# Patient Record
Sex: Female | Born: 1995
Health system: Southern US, Community
[De-identification: ages and names within clinical notes are randomized; demographics above are authoritative.]

## PROBLEM LIST (undated history)

## (undated) DIAGNOSIS — Z789 Other specified health status: Secondary | ICD-10-CM

## (undated) DIAGNOSIS — J45909 Unspecified asthma, uncomplicated: Secondary | ICD-10-CM

## (undated) DIAGNOSIS — O139 Gestational [pregnancy-induced] hypertension without significant proteinuria, unspecified trimester: Secondary | ICD-10-CM

## (undated) HISTORY — PX: WISDOM TOOTH EXTRACTION: SHX21

## (undated) HISTORY — DX: Gestational (pregnancy-induced) hypertension without significant proteinuria, unspecified trimester: O13.9

## (undated) HISTORY — DX: Other specified health status: Z78.9

---
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
## 2014-03-16 ENCOUNTER — Encounter (HOSPITAL_COMMUNITY): Payer: Self-pay | Admitting: Emergency Medicine

## 2014-03-16 ENCOUNTER — Emergency Department (HOSPITAL_COMMUNITY)
Admission: EM | Admit: 2014-03-16 | Discharge: 2014-03-16 | Disposition: A | Discharge status: Home / Self Care | Payer: Medicaid Other | Attending: Emergency Medicine | Admitting: Emergency Medicine

## 2014-03-16 DIAGNOSIS — R454 Irritability and anger: Secondary | ICD-10-CM | POA: Insufficient documentation

## 2014-03-16 DIAGNOSIS — M549 Dorsalgia, unspecified: Secondary | ICD-10-CM

## 2014-03-16 DIAGNOSIS — M545 Low back pain, unspecified: Secondary | ICD-10-CM | POA: Insufficient documentation

## 2014-03-16 DIAGNOSIS — N644 Mastodynia: Secondary | ICD-10-CM | POA: Insufficient documentation

## 2014-03-16 DIAGNOSIS — Z87891 Personal history of nicotine dependence: Secondary | ICD-10-CM | POA: Insufficient documentation

## 2014-03-16 DIAGNOSIS — R109 Unspecified abdominal pain: Secondary | ICD-10-CM

## 2014-03-16 DIAGNOSIS — Z3202 Encounter for pregnancy test, result negative: Secondary | ICD-10-CM | POA: Insufficient documentation

## 2014-03-16 DIAGNOSIS — N390 Urinary tract infection, site not specified: Secondary | ICD-10-CM | POA: Insufficient documentation

## 2014-03-16 DIAGNOSIS — R635 Abnormal weight gain: Secondary | ICD-10-CM | POA: Insufficient documentation

## 2014-03-16 LAB — CBC WITH DIFFERENTIAL/PLATELET
Basophils Absolute: 0 10*3/uL (ref 0.0–0.1)
Basophils Relative: 0 % (ref 0–1)
Eosinophils Absolute: 0.1 10*3/uL (ref 0.0–0.7)
Eosinophils Relative: 1 % (ref 0–5)
HCT: 43.7 % (ref 36.0–46.0)
Hemoglobin: 15.3 g/dL — ABNORMAL HIGH (ref 12.0–15.0)
Lymphocytes Relative: 29 % (ref 12–46)
Lymphs Abs: 3.1 10*3/uL (ref 0.7–4.0)
MCH: 29.2 pg (ref 26.0–34.0)
MCHC: 35 g/dL (ref 30.0–36.0)
MCV: 83.4 fL (ref 78.0–100.0)
Monocytes Absolute: 0.7 10*3/uL (ref 0.1–1.0)
Monocytes Relative: 7 % (ref 3–12)
Neutro Abs: 6.8 10*3/uL (ref 1.7–7.7)
Neutrophils Relative %: 63 % (ref 43–77)
Platelets: 299 10*3/uL (ref 150–400)
RBC: 5.24 MIL/uL — ABNORMAL HIGH (ref 3.87–5.11)
RDW: 13.2 % (ref 11.5–15.5)
WBC: 10.6 10*3/uL — ABNORMAL HIGH (ref 4.0–10.5)

## 2014-03-16 LAB — BASIC METABOLIC PANEL
BUN: 12 mg/dL (ref 6–23)
CO2: 23 mEq/L (ref 19–32)
Calcium: 10 mg/dL (ref 8.4–10.5)
Chloride: 100 mEq/L (ref 96–112)
Creatinine, Ser: 0.53 mg/dL (ref 0.50–1.10)
GFR calc Af Amer: >90 mL/min (ref >90)
GFR calc non Af Amer: >90 mL/min (ref >90)
Glucose, Bld: 110 mg/dL — ABNORMAL HIGH (ref 70–99)
Potassium: 3.5 mEq/L — ABNORMAL LOW (ref 3.7–5.3)
Sodium: 139 mEq/L (ref 137–147)

## 2014-03-16 LAB — POC URINE PREG, ED: Preg Test, Ur: NEGATIVE

## 2014-03-16 LAB — URINE MICROSCOPIC-ADD ON

## 2014-03-16 LAB — URINALYSIS, ROUTINE W REFLEX MICROSCOPIC
Bilirubin Urine: NEGATIVE
Glucose, UA: NEGATIVE mg/dL
Nitrite: NEGATIVE
Protein, ur: 30 mg/dL — AB
Specific Gravity, Urine: 1.025 (ref 1.005–1.030)
Urobilinogen, UA: 0.2 mg/dL (ref 0.0–1.0)
pH: 7.5 (ref 5.0–8.0)

## 2014-03-16 LAB — HCG, QUANTITATIVE, PREGNANCY: hCG, Beta Chain, Quant, S: <1 m[IU]/mL (ref <5)

## 2014-03-16 MED ORDER — TRAMADOL HCL 50 MG PO TABS: 50.0000 mg | ORAL_TABLET | Freq: Four times a day (QID) | ORAL | Status: DC | PRN

## 2014-03-16 MED ORDER — SODIUM CHLORIDE 0.9 % IV BOLUS (SEPSIS)
1000.0000 mL | Freq: Once | INTRAVENOUS | Status: AC
  Administered 2014-03-16: 1000 mL via INTRAVENOUS

## 2014-03-16 MED ORDER — CEPHALEXIN 500 MG PO CAPS: 500.0000 mg | ORAL_CAPSULE | Freq: Four times a day (QID) | ORAL | Status: DC

## 2014-03-16 MED ORDER — MORPHINE SULFATE 4 MG/ML IJ SOLN
INTRAMUSCULAR | Status: AC
  Filled 2014-03-16: qty 1

## 2014-03-16 MED ORDER — MORPHINE SULFATE 4 MG/ML IJ SOLN
4.0000 mg | Freq: Once | INTRAMUSCULAR | Status: AC
  Administered 2014-03-16: 4 mg via INTRAVENOUS

## 2014-03-16 MED ORDER — DEXTROSE 5 % IV SOLN
1.0000 g | Freq: Once | INTRAVENOUS | Status: AC
  Administered 2014-03-16: 1 g via INTRAVENOUS
  Filled 2014-03-16: qty 10

## 2014-03-16 NOTE — ED Notes (Signed)
Pt states that she missed her period 5 weeks ago, has taken several home pregnancy tests with positive results, has had continuous lower back and lower abd pain for the past 5 weeks and started having heavy vaginal bleeding today, pt reports that she has changed her maxi pad twice since the bleeding started at 2:30pm,

## 2014-03-16 NOTE — ED Provider Notes (Signed)
CSN: [id number]     Arrival date & time 03/16/14  1541 History   First MD Initiated Contact with Patient 03/16/14 1623     Chief Complaint  Patient presents with  . Vaginal Bleeding     (Consider location/radiation/quality/duration/timing/severity/associated sxs/prior.  Treatment) HPI  18yf with vaginal bleeding. She think she is pregnant. Last period in March. Posiitive home pregnancy test 5 weeks ago. She reports weight gain, breast tenderness and mood changes such as irritability. Began having crampy lower abdominal pain, lower back pain and vaginal bleeding today. No fevers or chills. His no urinary complaints. N significant past medical history. No prior pregnancies.  History reviewed. No pertinent past medical history. History reviewed. No pertinent past surgical history. No family history on file. History  Substance Use Topics  . Smoking status: Former Games developermoker  . Smokeless tobacco: Not on file  . Alcohol Use: No   OB History   Grav Para Term Preterm Abortions TAB SAB Ect Mult Living                 Review of Systems  All systems reviewed and negative, other than as noted in HPI.   Allergies  Review of patient's allergies indicates no known allergies.  Home Medications   Prior to Admission medications   Not on File   BP 166/106  Pulse 132  Temp(Src) 98.4 F (36.9 C) (Oral)  Resp 20  Ht 4' 10.5" (1.486 m)  Wt 169 lb 7 oz (76.856 kg)  BMI 34.80 kg/m2  SpO2 95%  LMP 01/09/2014 Physical Exam  Nursing note and vitals reviewed. Constitutional: She is oriented to person, place, and time. She appears well-developed and well-nourished. No distress.  HENT:  Head: Normocephalic and atraumatic.  Eyes: Conjunctivae are normal. Right eye exhibits no discharge. Left eye exhibits no discharge.  Neck: Neck supple.  Cardiovascular: Normal rate, regular rhythm and normal heart sounds.  Exam reveals no gallop and no friction rub.   No murmur heard. Pulmonary/Chest: Effort  normal and breath sounds normal. No respiratory distress.  Abdominal: Soft. She exhibits no distension. There is no tenderness.  Genitourinary:  No cva tenderness  Musculoskeletal: She exhibits no edema and no tenderness.  Neurological: She is alert and oriented to person, place, and time.  Skin: Skin is warm and dry.  Psychiatric: She has a normal mood and affect. Her behavior is normal. Thought content normal.    ED Course  Procedures (including critical care time) Labs Review Labs Reviewed  CBC WITH DIFFERENTIAL - Abnormal; Notable for the following:    WBC 10.6 (*)    RBC 5.24 (*)    Hemoglobin 15.3 (*)    All other components within normal limits  BASIC METABOLIC PANEL - Abnormal; Notable for the following:    Potassium 3.5 (*)    Glucose, Bld 110 (*)    All other components within normal limits  HCG, QUANTITATIVE, PREGNANCY  TSH  URINALYSIS, ROUTINE W REFLEX MICROSCOPIC  POC URINE PREG, ED    Imaging Review No results found.   EKG Interpretation   Date/Time:  Sunday Mar 16 2014 17:29:32 EDT Ventricular Rate:  97 PR Interval:  148 QRS Duration: 91 QT Interval:  350 QTC Calculation: 445 R Axis:   13 Text Interpretation:  Sinus rhythm ED PHYSICIAN INTERPRETATION AVAILABLE  IN [facility] Confirmed by TEST, Record ([id number]) on 03/18/2014 9:31:22  AM      MDM   Final diagnoses:  UTI (urinary tract infection)  Abdominal pain  Back pain    18yF who thinks she is pregnant. She is not. Possible UTI. With crampy lower abdominal pain, will tx. Return precautions dicussed.     Raeford RazorStephen Duane Trias, MD 03/24/14 [phone]

## 2014-03-16 NOTE — ED Notes (Signed)
IV attempt x 2 without success, CN to assess for access

## 2014-03-16 NOTE — Discharge Instructions (Signed)
Abdominal Pain, Adult °Many things can cause abdominal pain. Usually, abdominal pain is not caused by a disease and will improve without treatment. It can often be observed and treated at home. Your health care provider will do a physical exam and possibly order blood tests and X-rays to help determine the seriousness of your pain. However, in many cases, more time must pass before a clear cause of the pain can be found. Before that point, your health care provider may not know if you need more testing or further treatment. °HOME CARE INSTRUCTIONS  °Monitor your abdominal pain for any changes. The following actions may help to alleviate any discomfort you are experiencing: °· Only take over-the-counter or prescription medicines as directed by your health care provider. °· Do not take laxatives unless directed to do so by your health care provider. °· Try a clear liquid diet (broth, tea, or water) as directed by your health care provider. Slowly move to a bland diet as tolerated. °SEEK MEDICAL CARE IF: °· You have unexplained abdominal pain. °· You have abdominal pain associated with nausea or diarrhea. °· You have pain when you urinate or have a bowel movement. °· You experience abdominal pain that wakes you in the night. °· You have abdominal pain that is worsened or improved by eating food. °· You have abdominal pain that is worsened with eating fatty foods. °SEEK IMMEDIATE MEDICAL CARE IF:  °· Your pain does not go away within 2 hours. °· You have a fever. °· You keep throwing up (vomiting). °· Your pain is felt only in portions of the abdomen, such as the right side or the left lower portion of the abdomen. °· You pass bloody or black tarry stools. °MAKE SURE YOU: °· Understand these instructions.   °· Will watch your condition.   °· Will get help right away if you are not doing well or get worse.   °Document Released: 08/03/2005 Document Revised: 08/14/2013 Document Reviewed: 07/03/2013 °ExitCare® Patient  Information ©2014 ExitCare, LLC. ° °Urinary Tract Infection °Urinary tract infections (UTIs) can develop anywhere along your urinary tract. Your urinary tract is your body's drainage system for removing wastes and extra water. Your urinary tract includes two kidneys, two ureters, a bladder, and a urethra. Your kidneys are a pair of bean-shaped organs. Each kidney is about the size of your fist. They are located below your ribs, one on each side of your spine. °CAUSES °Infections are caused by microbes, which are microscopic organisms, including fungi, viruses, and bacteria. These organisms are so small that they can only be seen through a microscope. Bacteria are the microbes that most commonly cause UTIs. °SYMPTOMS  °Symptoms of UTIs may vary by age and gender of the patient and by the location of the infection. Symptoms in young women typically include a frequent and intense urge to urinate and a painful, burning feeling in the bladder or urethra during urination. Older women and men are more likely to be tired, shaky, and weak and have muscle aches and abdominal pain. A fever may mean the infection is in your kidneys. Other symptoms of a kidney infection include pain in your back or sides below the ribs, nausea, and vomiting. °DIAGNOSIS °To diagnose a UTI, your caregiver will ask you about your symptoms. Your caregiver also will ask to provide a urine sample. The urine sample will be tested for bacteria and white blood cells. White blood cells are made by your body to help fight infection. °TREATMENT  °Typically, UTIs can be   treated with medication. Because most UTIs are caused by a bacterial infection, they usually can be treated with the use of antibiotics. The choice of antibiotic and length of treatment depend on your symptoms and the type of bacteria causing your infection. HOME CARE INSTRUCTIONS  If you were prescribed antibiotics, take them exactly as your caregiver instructs you. Finish the medication  even if you feel better after you have only taken some of the medication.  Drink enough water and fluids to keep your urine clear or pale yellow.  Avoid caffeine, tea, and carbonated beverages. They tend to irritate your bladder.  Empty your bladder often. Avoid holding urine for long periods of time.  Empty your bladder before and after sexual intercourse.  After a bowel movement, women should cleanse from front to back. Use each tissue only once. SEEK MEDICAL CARE IF:   You have back pain.  You develop a fever.  Your symptoms do not begin to resolve within 3 days. SEEK IMMEDIATE MEDICAL CARE IF:   You have severe back pain or lower abdominal pain.  You develop chills.  You have nausea or vomiting.  You have continued burning or discomfort with urination. MAKE SURE YOU:   Understand these instructions.  Will watch your condition.  Will get help right away if you are not doing well or get worse. Document Released: 08/03/2005 Document Revised: 04/24/2012 Document Reviewed: 12/02/2011 [facility] Patient Information 2014 [place]Care, [place].   Emergency Department Resource Guide 1) Find a Doctor and Pay Out of Pocket Although you won't have to find out who is covered by your insurance plan, it is a good idea to ask around and get recommendations. You will then need to call the office and see if the doctor you have chosen will accept you as a new patient and what types of options they offer for patients who are self-pay. Some doctors offer discounts or will set up payment plans for their patients who do not have insurance, but you will need to ask so you aren't surprised when you get to your appointment.  2) Contact Your Local Health Department Not all health departments have doctors that can see patients for sick visits, but many do, so it is worth a call to see if yours does. If you don't know where your local health department is, you can check in your phone book. The CDC also has  a tool to help you locate your state's health department, and many state websites also have listings of all of their local health departments.  3) Find a Walk-in Clinic If your illness is not likely to be very severe or complicated, you may want to try a walk in clinic. These are popping up all over the country in pharmacies, drugstores, and shopping centers. They're usually staffed by nurse practitioners or physician assistants that have been trained to treat common illnesses and complaints. They're usually fairly quick and inexpensive. However, if you have serious medical issues or chronic medical problems, these are probably not your best option.  No Primary Care Doctor: - Call Health Connect at  [phone] - they can help you locate a primary care doctor that  accepts your insurance, provides certain services, etc. - Physician Referral Service- [phone]  Chronic Pain Problems: Organization         Address  Phone   Notes  [facility]  [phone] Patients need to be referred by their primary care doctor.   Medication Assistance: Organization  Address  Phone   Notes  [facility]ication [facility] [address] [place], [place] [address] [phone] --Must be a resident of [facility] [place] -- Must have NO insurance coverage whatsoever (no Medicaid/ Medicare, etc.) -- The pt. MUST have a primary care doctor that directs their care regularly and follows them in the community   MedAssist  [phone]   [employer]  [phone]    Agencies that provide inexpensive medical care: Organization         Address  Phone   Notes  [facility]  [phone]   [facility]    [phone]   [facility] [address] [place], [place] [address] [phone]   [facility] [address], [place] [phone]   Planned Parenthood    [phone]) [phone]    [facility]    [phone]) [phone]   [facility]  [address], [place] Phone:  [phone], Fax:  [phone] Hours of Operation:  9 am - 6 pm, M-F.  Also accepts Medicaid/Medicare and self-pay.  [facility]  [address], [place] Phone: [phone]) [phone], Fax: [phone]. Hours of Operation:  8:30 am - 5:30 pm, M-F.  Also accepts Medicaid and self-pay.  [facility] [place] [address], [place] Phone: [phone]   [facility] [address], [place], [place] [phone], Ext. [phone] Mondays & Thursdays: 7-9 AM.  First 15 patients are seen on a first come, first serve basis.    Medicaid-accepting [place] [place] Providers:  Organization         Address  Phone   Notes  [facility] [address], [place] [phone] Also accepts self-pay patients.  [facility] [address] [place], [place]  [phone]   [facility] [address], [place] [phone]   [facility] [address] [place] [address], [place] [phone]   Renaye RakersVeita Bland [address], [place]   [phone] Only accepts [place] Access [place]icaid patients after they have their name applied to their card.   Self-Pay (no insurance) in [facility] [place]:  Organization         Address  Phone   Notes  Sickle Cell Patients, [facility] [address], [place] [phone]   [facility] [address] [place], [place] [phone]) [phone]   [facility] [place]  [address], [place] [phone]   [facility]/Dr. Osei-Bonsu  [address] [place] [address] [place] [address], [place] or [phone], [place] [phone] Phone number for both [place] and [place] locations is the same.  [facility] [address], [place] [phone]   [facility] [place] [address] [place] [address], [place] or [address] [phone]   [facility] [address], [place] [phone], phone; [phone]) [phone], fax Sees patients 1st and 3rd Saturday of every month.  Must not qualify for public or private insurance (i.e. Medicaid, Medicare, [place] Health Choice, Veterans' Benefits)  Household income should be no more than 200% of the poverty level The clinic cannot treat you if you are pregnant or think you  are pregnant  Sexually transmitted diseases are not treated at the clinic.    Dental Care: Organization         Address  Phone  Notes  [facility] Department of [facility] [address], [place] [phone]) [phone] Accepts children up to age 19 who are enrolled in [place]icaid or [place] Health Choice; pregnant women with a Medicaid card; and children who have applied for Medicaid or [place] Health Choice, but were declined, whose parents can pay a reduced fee at time of service.  [facility] of [facility] [place]  [address], [place] [phone] Accepts children up to age 19 who are enrolled in [place]icaid or [place] Health Choice; pregnant women with a Medicaid card; and children who have applied for Medicaid or [place] Health Choice, but were declined, whose parents can pay a reduced fee at time of service.  [facility]  [address], [place] [phone]) [phone] Patients are seen by appointment only. Walk-ins are not accepted. [facility] will see patients 19 years of age and older. Monday - Tuesday (8am-5pm) Most Wednesdays (8:30-5pm) $30 per visit, cash only  [facility]  [address], [place] [phone]) [phone] Patients are seen by appointment only. Walk-ins are not accepted. [facility] will see patients 19 years of age and older. One Wednesday  Evening (Monthly: Volunteer Based).  $30 per visit, cash only  [employer] [facility]  [phone] for adults; Children under age 364, call Graduate Pediatric Dentistry at [phone]. Children aged 614-14, please call [phone] to request a pediatric application.  Dental services are provided in all areas of dental care including fillings, crowns and bridges, complete and partial dentures, implants, gum treatment, root canals, and extractions. Preventive care is also provided. Treatment is provided to both adults and children. Patients are selected via a lottery and there is often a waiting list.   [facility] [address], [place]  [phone] [email or web address]   [facility] [address], [place], [place] [phone], Ext. [phone] Second and Fourth Thursday of each month, opens at 6:30 AM; Clinic ends at 9 AM.  Patients are seen on a first-come first-served basis, and a limited number are seen during each clinic.   [facility]  [address], [place], [place] [phone]) [phone]   Eligibility Requirements You must have lived in [place], [place], or [place] counties for at least the last three months.   You cannot be eligible for state or federal sponsored National Cityhealthcare insurance, including CIGNAVeterans Administration, [place]icaid, or Harrah[employer] EntertainmentMedicare.   You generally cannot be eligible for healthcare insurance through your employer.    How to apply: Eligibility screenings are held every Tuesday and Wednesday afternoon from 1:00 pm until 4:00 pm. You do not need an appointment for the interview!  [facility] [address], [place], [place] [phone]   [facility]  [phone]   [facility]  [phone]   [facility]  [phone]    Behavioral Health Resources in the Community: Intensive Outpatient Programs Organization         Address  Phone  Notes  [facility] [address], [place], [place] [phone]   [facility] [address], [place], [place] [phone]   [facility] [address], [place], [place]   [place] [address]  [place], [place] or [phone]   Substance Abuse Resources Organization         Address  Phone  Notes  Alcohol and Drug Services  [phone]   [place]  [phone]   [facility] [place]   Chinita Pester  [phone]   Residential & Outpatient Substance Abuse Program  [phone]   Psychological Services Organization         Address  Phone  Notes  [facility]  Keys  [phone]   [place] [address] [place] or [phone]    Mobile Crisis Teams Organization         Address  Phone  Notes  [facility], Mobile Crisis Care Unit  [phone]   [facility]  [address] [place], [place]   [place] Levels [address] [place] [phone]    Self-Help/Support Groups Organization         Address  Phone             Notes  [place] - variety of support groups  [place] Call for more information  Narcotics Anonymous (NA), [facility] [address], [employer] [place]  2 meetings at this location   Special educational needs teacher         Address  Phone  Notes  [facility]AP Residential Treatment [place],    [place]  [phone]   [facility]  [address], [place] [address], [place], [place]   [place], [place] [phone] Admissions: 8am-3pm M-F  Incentives Substance [place] [address],    [place], [place] [phone]   [facility] [address] [place],  [place], [place]   [facility] [address],  [place], [place]   [facility] Programs - Intensive Outpatient [address], [place], [place]   [facility] ([place].,  [place], [place] [phone] or [phone]   [facility] (RTS) [address], [place], [place] Accepts Medicaid  [facility] [place] [address]  [place] [phone] Substance Abuse/Addiction Treatment   [facility] Organization         Address  Phone  Notes  [facility]  [phone]   Domenic Schwab, PhD [address]lis Porta [place], [place]   [phone] or [phone]   [place], [place] [phone]   [facility] [address] [place], [place] [phone] Insurance/Medicaid/sponsorship through [facility] and Families [address]                                    [place], [place] [phone] [place] [address], [place] [phone]    Dr. Adele Schilder  [phone]   [facility]. 1) [address], [place] 2) [place] 3)  [address], [place] [phone]  [phone]   [place] [phone])  [phone] or [phone] (After Hours)

## 2014-03-16 NOTE — ED Notes (Addendum)
Pt with heavy vaginal bleeding starting at 1430 today, reports positive pregnancy tests and had not seen OB/GYN, c/o abd cramping and back pain as well, states this was first pregnancy; pt denies passing clots

## 2014-03-17 LAB — TSH: TSH: 3.63 u[IU]/mL (ref 0.350–4.500)

## 2014-03-18 LAB — URINE CULTURE: Colony Count: 30000

## 2015-09-09 ENCOUNTER — Encounter (HOSPITAL_COMMUNITY): Payer: Self-pay | Admitting: *Deleted

## 2015-09-09 ENCOUNTER — Emergency Department (HOSPITAL_COMMUNITY)
Admission: EM | Admit: 2015-09-09 | Discharge: 2015-09-09 | Disposition: A | Discharge status: Home / Self Care | Payer: Medicaid Other | Attending: Emergency Medicine | Admitting: Emergency Medicine

## 2015-09-09 ENCOUNTER — Emergency Department (HOSPITAL_COMMUNITY): Payer: Medicaid Other

## 2015-09-09 DIAGNOSIS — Z3202 Encounter for pregnancy test, result negative: Secondary | ICD-10-CM | POA: Insufficient documentation

## 2015-09-09 DIAGNOSIS — R109 Unspecified abdominal pain: Secondary | ICD-10-CM

## 2015-09-09 DIAGNOSIS — N390 Urinary tract infection, site not specified: Secondary | ICD-10-CM

## 2015-09-09 DIAGNOSIS — R112 Nausea with vomiting, unspecified: Secondary | ICD-10-CM

## 2015-09-09 DIAGNOSIS — Z87891 Personal history of nicotine dependence: Secondary | ICD-10-CM | POA: Insufficient documentation

## 2015-09-09 LAB — URINALYSIS, ROUTINE W REFLEX MICROSCOPIC
Glucose, UA: NEGATIVE mg/dL
Ketones, ur: >80 mg/dL — AB
Nitrite: NEGATIVE
Protein, ur: 30 mg/dL — AB
Specific Gravity, Urine: 1.02 (ref 1.005–1.030)
Urobilinogen, UA: 1 mg/dL (ref 0.0–1.0)
pH: 6 (ref 5.0–8.0)

## 2015-09-09 LAB — COMPREHENSIVE METABOLIC PANEL
ALT: 10 U/L — ABNORMAL LOW (ref 14–54)
AST: 20 U/L (ref 15–41)
Albumin: 4.3 g/dL (ref 3.5–5.0)
Alkaline Phosphatase: 66 U/L (ref 38–126)
Anion gap: 12 (ref 5–15)
BUN: 13 mg/dL (ref 6–20)
CO2: 23 mmol/L (ref 22–32)
Calcium: 9.1 mg/dL (ref 8.9–10.3)
Chloride: 99 mmol/L — ABNORMAL LOW (ref 101–111)
Creatinine, Ser: 0.66 mg/dL (ref 0.44–1.00)
GFR calc Af Amer: >60 mL/min (ref >60)
GFR calc non Af Amer: >60 mL/min (ref >60)
Glucose, Bld: 111 mg/dL — ABNORMAL HIGH (ref 65–99)
Potassium: 2.9 mmol/L — ABNORMAL LOW (ref 3.5–5.1)
Sodium: 134 mmol/L — ABNORMAL LOW (ref 135–145)
Total Bilirubin: 0.9 mg/dL (ref 0.3–1.2)
Total Protein: 8.2 g/dL — ABNORMAL HIGH (ref 6.5–8.1)

## 2015-09-09 LAB — CBC WITH DIFFERENTIAL/PLATELET
Basophils Absolute: 0 10*3/uL (ref 0.0–0.1)
Basophils Relative: 0 %
Eosinophils Absolute: 0 10*3/uL (ref 0.0–0.7)
Eosinophils Relative: 0 %
HCT: 38.2 % (ref 36.0–46.0)
Hemoglobin: 13 g/dL (ref 12.0–15.0)
Lymphocytes Relative: 8 %
Lymphs Abs: 1.5 10*3/uL (ref 0.7–4.0)
MCH: 29 pg (ref 26.0–34.0)
MCHC: 34 g/dL (ref 30.0–36.0)
MCV: 85.3 fL (ref 78.0–100.0)
Monocytes Absolute: 1.8 10*3/uL — ABNORMAL HIGH (ref 0.1–1.0)
Monocytes Relative: 10 %
Neutro Abs: 14.9 10*3/uL — ABNORMAL HIGH (ref 1.7–7.7)
Neutrophils Relative %: 82 %
Platelets: 203 10*3/uL (ref 150–400)
RBC: 4.48 MIL/uL (ref 3.87–5.11)
RDW: 13.3 % (ref 11.5–15.5)
WBC: 18.2 10*3/uL — ABNORMAL HIGH (ref 4.0–10.5)

## 2015-09-09 LAB — PREGNANCY, URINE: Preg Test, Ur: NEGATIVE

## 2015-09-09 LAB — LIPASE, BLOOD: Lipase: 16 U/L (ref 11–51)

## 2015-09-09 LAB — URINE MICROSCOPIC-ADD ON

## 2015-09-09 MED ORDER — POTASSIUM CHLORIDE CRYS ER 20 MEQ PO TBCR
40.0000 meq | EXTENDED_RELEASE_TABLET | Freq: Once | ORAL | Status: AC
  Administered 2015-09-09: 40 meq via ORAL

## 2015-09-09 MED ORDER — SODIUM CHLORIDE 0.9 % IV BOLUS (SEPSIS)
500.0000 mL | Freq: Once | INTRAVENOUS | Status: AC
  Administered 2015-09-09: 500 mL via INTRAVENOUS

## 2015-09-09 MED ORDER — SODIUM CHLORIDE 0.9 % IV SOLN: INTRAVENOUS | Status: DC

## 2015-09-09 MED ORDER — POTASSIUM CHLORIDE 10 MEQ/100ML IV SOLN
10.0000 meq | Freq: Once | INTRAVENOUS | Status: AC
  Administered 2015-09-09: 10 meq via INTRAVENOUS
  Filled 2015-09-09: qty 100

## 2015-09-09 MED ORDER — FAMOTIDINE IN NACL 20-0.9 MG/50ML-% IV SOLN
20.0000 mg | Freq: Once | INTRAVENOUS | Status: AC
  Administered 2015-09-09: 20 mg via INTRAVENOUS
  Filled 2015-09-09: qty 50

## 2015-09-09 MED ORDER — CEPHALEXIN 500 MG PO CAPS: 500.0000 mg | ORAL_CAPSULE | Freq: Four times a day (QID) | ORAL | Status: DC

## 2015-09-09 MED ORDER — ONDANSETRON HCL 4 MG/2ML IJ SOLN
4.0000 mg | INTRAMUSCULAR | Status: AC | PRN
  Administered 2015-09-09 (×2): 4 mg via INTRAVENOUS
  Filled 2015-09-09 (×2): qty 2

## 2015-09-09 MED ORDER — POTASSIUM CHLORIDE CRYS ER 20 MEQ PO TBCR
40.0000 meq | EXTENDED_RELEASE_TABLET | Freq: Once | ORAL | Status: DC
  Filled 2015-09-09: qty 2

## 2015-09-09 MED ORDER — ONDANSETRON HCL 4 MG PO TABS: 4.0000 mg | ORAL_TABLET | Freq: Three times a day (TID) | ORAL | Status: DC | PRN

## 2015-09-09 MED ORDER — ONDANSETRON HCL 4 MG/2ML IJ SOLN
4.0000 mg | Freq: Once | INTRAMUSCULAR | Status: AC
  Administered 2015-09-09: 4 mg via INTRAVENOUS
  Filled 2015-09-09: qty 2

## 2015-09-09 NOTE — ED Provider Notes (Signed)
CSN: [id number]     Arrival date & time 09/09/15  1222 History   First MD Initiated Contact with Patient 09/09/15 1232     Chief Complaint  Patient presents with  . Abdominal Pain      HPI Pt was seen at 1235.  Per pt, c/o gradual onset and persistence of constant upper abd "pain" for the past 2 to 3 days.  Has been associated with multiple intermittent episodes of N/V.  Describes the abd pain as "aching."  States her mother has similar symptoms. Denies diarrhea, no fevers, no back pain, no rash, no CP/SOB, no black or blood in stools or emesis, no dysuria/hematuria.       History reviewed. No pertinent past medical history.   History reviewed. No pertinent past surgical history.  Social History  Substance Use Topics  . Smoking status: Former Games developer  . Smokeless tobacco: None  . Alcohol Use: No    Review of Systems ROS: Statement: All systems negative except as marked or noted in the HPI; Constitutional: Negative for fever and chills. ; ; Eyes: Negative for eye pain, redness and discharge. ; ; ENMT: Negative for ear pain, hoarseness, nasal congestion, sinus pressure and sore throat. ; ; Cardiovascular: Negative for chest pain, palpitations, diaphoresis, dyspnea and peripheral edema. ; ; Respiratory: Negative for cough, wheezing and stridor. ; ; Gastrointestinal: +N/V, abd pain. Negative for diarrhea, blood in stool, hematemesis, jaundice and rectal bleeding. . ; ; Genitourinary: Negative for dysuria, flank pain and hematuria. ; ; Musculoskeletal: Negative for back pain and neck pain. Negative for swelling and trauma.; ; Skin: Negative for pruritus, rash, abrasions, blisters, bruising and skin lesion.; ; Neuro: Negative for headache, lightheadedness and neck stiffness. Negative for weakness, altered level of consciousness , altered mental status, extremity weakness, paresthesias, involuntary movement, seizure and syncope.       Allergies  Review of patient's allergies indicates no  known allergies.  Home Medications   Prior to Admission medications   Medication Sig Start Date End Date Taking? Authorizing Provider  acetaminophen (TYLENOL) 325 MG tablet Take by mouth every 6 (six) hours as needed.   Yes Historical Provider, MD   BP 127/94 mmHg  Pulse 98  Temp(Src) 97.6 F (36.4 C) (Oral)  Resp 20  Ht 4' 10.5" (1.486 m)  Wt 169 lb (76.658 kg)  BMI 34.72 kg/m2  SpO2 98%  LMP 08/21/2015 Physical Exam  1240: Physical examination:  Nursing notes reviewed; Vital signs and O2 SAT reviewed;  Constitutional: Well developed, Well nourished, Well hydrated, Uncomfortable appearing.; Head:  Normocephalic, atraumatic; Eyes: EOMI, PERRL, No scleral icterus; ENMT: Mouth and pharynx normal, Mucous membranes moist; Neck: Supple, Full range of motion, No lymphadenopathy; Cardiovascular: Regular rate and rhythm, No murmur, rub, or gallop; Respiratory: Breath sounds clear & equal bilaterally, No rales, rhonchi, wheezes.  Speaking full sentences with ease, Normal respiratory effort/excursion; Chest: Nontender, Movement normal; Abdomen: Soft, +RUQ < mid-epigastric, LUQ tenderness to palp. No rebound or guarding.  Nondistended, Normal bowel sounds; Genitourinary: No CVA tenderness; Extremities: Pulses normal, No tenderness, No edema, No calf edema or asymmetry.; Neuro: AA&Ox3, Major CN grossly intact.  Speech clear. No gross focal motor or sensory deficits in extremities.; Skin: Color normal, Warm, Dry.   ED Course  Procedures (including critical care time) Labs Review   Imaging Review  I have personally reviewed and evaluated these images and lab results as part of my medical decision-making.   EKG Interpretation None      MDM  MDM Reviewed: previous chart, nursing note and vitals Reviewed previous: labs Interpretation: labs and ultrasound      Results for orders placed or performed during the hospital encounter of 09/09/15  Pregnancy, urine  Result Value Ref Range    Preg Test, Ur NEGATIVE NEGATIVE  Urinalysis, Routine w reflex microscopic  Result Value Ref Range   Color, Urine YELLOW YELLOW   APPearance HAZY (A) CLEAR   Specific Gravity, Urine 1.020 1.005 - 1.030   pH 6.0 5.0 - 8.0   Glucose, UA NEGATIVE NEGATIVE mg/dL   Hgb urine dipstick MODERATE (A) NEGATIVE   Bilirubin Urine SMALL (A) NEGATIVE   Ketones, ur >80 (A) NEGATIVE mg/dL   Protein, ur 30 (A) NEGATIVE mg/dL   Urobilinogen, UA 1.0 0.0 - 1.0 mg/dL   Nitrite NEGATIVE NEGATIVE   Leukocytes, UA MODERATE (A) NEGATIVE  Comprehensive metabolic panel  Result Value Ref Range   Sodium 134 (L) 135 - 145 mmol/L   Potassium 2.9 (L) 3.5 - 5.1 mmol/L   Chloride 99 (L) 101 - 111 mmol/L   CO2 23 22 - 32 mmol/L   Glucose, Bld 111 (H) 65 - 99 mg/dL   BUN 13 6 - 20 mg/dL   Creatinine, Ser 1.610.66 0.44 - 1.00 mg/dL   Calcium 9.1 8.9 - 09.610.3 mg/dL   Total Protein 8.2 (H) 6.5 - 8.1 g/dL   Albumin 4.3 3.5 - 5.0 g/dL   AST 20 15 - 41 U/L   ALT 10 (L) 14 - 54 U/L   Alkaline Phosphatase 66 38 - 126 U/L   Total Bilirubin 0.9 0.3 - 1.2 mg/dL   GFR calc non Af Amer >60 >60 mL/min   GFR calc Af Amer >60 >60 mL/min   Anion gap 12 5 - 15  Lipase, blood  Result Value Ref Range   Lipase 16 11 - 51 U/L  CBC with Differential  Result Value Ref Range   WBC 18.2 (H) 4.0 - 10.5 K/uL   RBC 4.48 3.87 - 5.11 MIL/uL   Hemoglobin 13.0 12.0 - 15.0 g/dL   HCT 04.538.2 40.936.0 - 81.146.0 %   MCV 85.3 78.0 - 100.0 fL   MCH 29.0 26.0 - 34.0 pg   MCHC 34.0 30.0 - 36.0 g/dL   RDW 91.413.3 78.211.5 - 95.615.5 %   Platelets 203 150 - 400 K/uL   Neutrophils Relative % 82 %   Neutro Abs 14.9 (H) 1.7 - 7.7 K/uL   Lymphocytes Relative 8 %   Lymphs Abs 1.5 0.7 - 4.0 K/uL   Monocytes Relative 10 %   Monocytes Absolute 1.8 (H) 0.1 - 1.0 K/uL   Eosinophils Relative 0 %   Eosinophils Absolute 0.0 0.0 - 0.7 K/uL   Basophils Relative 0 %   Basophils Absolute 0.0 0.0 - 0.1 K/uL  Urine microscopic-add on  Result Value Ref Range   Squamous Epithelial  / LPF MANY (A) RARE   WBC, UA 11-20 <3 WBC/hpf   RBC / HPF 7-10 <3 RBC/hpf   Bacteria, UA FEW (A) RARE   [place] Abdomen Complete 09/09/2015  CLINICAL DATA:  Acute onset upper abdominal pain and nausea and vomiting 2 days ago. EXAM: ULTRASOUND ABDOMEN COMPLETE COMPARISON:  None. FINDINGS: Gallbladder: No gallstones or wall thickening visualized. No sonographic Murphy sign noted. Common bile duct: Diameter: 5 mm, within normal limits. Liver: No focal lesion identified. Within normal limits in parenchymal echo genicity. IVC: No abnormality visualized. Pancreas: Visualized portion unremarkable. Spleen: Size and appearance within normal limits.  Right Kidney: Length: 11.1 cm. Echogenicity within normal limits. No mass or hydronephrosis visualized. Left Kidney: Length: 10.8 cm. Echogenicity within normal limits. No mass or hydronephrosis visualized. Abdominal aorta: No aneurysm visualized. Other findings: None. IMPRESSION: Negative abdominal ultrasound. Electronically Signed   By: Myles Rosenthal M.D.   On: 09/09/2015 14:30    1500:  Pt continues to c/o N/V. Will re-dose zofran. Potassium repleted IV.  Wiill attempt PO potassium/PO challenge when pt less nauseated.   1545:  Pt has now tol PO well while in the ED without N/V.  No stooling while in the ED.  Abd benign, VSS. Feels better and wants to go home now. Dx and testing d/w pt and family.  Questions answered.  Verb understanding, agreeable to d/c home with outpt f/u.       Samuel Jester, DO 09/13/15 2034

## 2015-09-09 NOTE — Discharge Instructions (Signed)
°Emergency Department Resource Guide °1) Find a Doctor and Pay Out of Pocket °Although you won't have to find out who is covered by your insurance plan, it is a good idea to ask around and get recommendations. You will then need to call the office and see if the doctor you have chosen will accept you as a new patient and what types of options they offer for patients who are self-pay. Some doctors offer discounts or will set up payment plans for their patients who do not have insurance, but you will need to ask so you aren't surprised when you get to your appointment. ° °2) Contact Your Local Health Department °Not all health departments have doctors that can see patients for sick visits, but many do, so it is worth a call to see if yours does. If you don't know where your local health department is, you can check in your phone book. The CDC also has a tool to help you locate your state's health department, and many state websites also have listings of all of their local health departments. ° °3) Find a Walk-in Clinic °If your illness is not likely to be very severe or complicated, you may want to try a walk in clinic. These are popping up all over the country in pharmacies, drugstores, and shopping centers. They're usually staffed by nurse practitioners or physician assistants that have been trained to treat common illnesses and complaints. They're usually fairly quick and inexpensive. However, if you have serious medical issues or chronic medical problems, these are probably not your best option. ° °No Primary Care Doctor: °- Call [facility] Connect at  832-8000 - they can help you locate a primary care doctor that  accepts your insurance, provides certain services, etc. °- Physician Referral Service- 1-800-533-3463 ° °Chronic Pain Problems: °Organization         Address  Phone   Notes  °[facility]  ([phone]) [phone] Patients need to be referred by their primary care doctor.  ° °Medication  Assistance: °Organization         Address  Phone   Notes  °[place] [facility] [address] °[place], [place] [address] (336) 641-8030 --Must be a resident of [place] °-- Must have NO insurance coverage whatsoever (no Medicaid/ Medicare, etc.) °-- The pt. MUST have a primary care doctor that directs their care regularly and follows them in the community °  °[facility]  (866) 331-1348   °[employer]  (888) 892-1162   ° °Agencies that provide inexpensive medical care: °Organization         Address  Phone   Notes  °[facility]  (336) 832-8035   °[facility]    (336) 832-7272   °[facility] [address] °[place], [place] [address] (336) 832-4777   °[facility] [address], °[place] (336) 271-4999   °[facility]    (336) 373-0678   °[facility]    (336) 272-1050   °[facility] ° [address], [place] Phone:  (336) 832-4444, Fax:  (336) 832-4440 Hours of Operation:  9 am - 6 pm, M-F.  Also accepts Medicaid/Medicare and self-pay.  °[facility] ° [address], [place] Phone: (336) [phone], Fax: (336) [phone]. Hours of Operation:  8:30 am - 5:30 pm, M-F.  Also accepts Medicaid and self-pay.  °[facility] [place] [address], [place] Phone: (336) 878-6027   °[facility] [address], [place], [place] (336)723-1848, Ext. 123 Mondays & Thursdays: 7-9 AM.  First 15 patients are seen on a first come, first serve basis. °  ° °Medicaid-accepting [place] Providers: ° °Organization         Address  Phone   Notes  °[facility] [address], [place] (336) 641-2100 Also accepts self-pay patients.  °[facility] [address], [place] ° (336) 856-9996   °[facility] [address], [place]  (336) 288-8857   °[facility] [address] [place] [address], [place] (336) 299-7000   °Veita Bland [address], [place]  ° (336) 373-1557 Only accepts [place] Access Medicaid patients after they have their name applied to their card.  ° °Self-Pay (no insurance) in [place]: ° °Organization         Address  Phone   Notes  °Sickle Cell Patients, [facility] [address], [place] (336) 832-1970   °[facility] [address], [place] (336) 832-4400   °[facility] ° [address], [place] (336) 992-4800   °[facility]/Dr. Osei-Bonsu ° [address] [place] [address], [place] or [address], [place] (336) [phone] Phone number for both [place] and [place] locations is the same.  °[facility] [address], [place] (336) 299-0000   °[facility] [address] [place] [address], [place] or [address] (336) 852-7530 °(336) 878-2260   °[facility] [address], [place] (336) 350-1642, phone; (336) 294-5005, fax Sees patients 1st and 3rd Saturday of every month.  Must not qualify for public or private insurance (i.e. Medicaid, Medicare, [place] Health Choice, Veterans' Benefits) • Household income should be no more than 200% of the poverty level •The clinic cannot treat you if you are pregnant or think you are pregnant • Sexually transmitted diseases are not treated at the clinic.  ° ° °Dental Care: °Organization         Address  Phone  Notes  °[place] [facility] [address], [place] (336) 641-6152 Accepts children up to age 21 who are enrolled in Medicaid or [place] Health Choice; pregnant women with a Medicaid card; and children who have applied for Medicaid or [place] Health Choice, but were declined, whose parents can pay a reduced fee at time of service.  °[place]  [facility] [place]  [address], [place] (336) 641-7733 Accepts children up to age 21 who are enrolled in Medicaid or [place] Health Choice; pregnant women with a Medicaid card; and children who have applied for Medicaid or [place] Health Choice, but were declined, whose parents can pay a reduced fee at time of service.  °[facility] ° [address], [place] (336) 641-4533 Patients are seen by appointment only. Walk-ins are not accepted. [facility] will see patients 18 years of age and older. °Monday - Tuesday (8am-5pm) °Most Wednesdays (8:30-5pm) °$30 per visit, cash only  °[facility] ° [address], [place] (336) 641-4533 Patients are seen by appointment only. Walk-ins are not accepted. [facility] will see patients 18 years of age and older. °One   Wednesday Evening (Monthly: Volunteer Based).  $30 per visit, cash only  °[facility]  (919) 537-3737 for adults; Children under age 4, call Graduate Pediatric Dentistry at ([phone]) [phone]. Children aged 4-14, please call ([phone]) [phone] to request a pediatric application. ° Dental services are provided in all areas of dental care including fillings, crowns and bridges, complete and partial dentures, implants, gum treatment, root canals, and extractions. Preventive care is also provided. Treatment is provided to both adults and children. °Patients are selected via a lottery and there is often a waiting list. °  °[facility] [address], °[place] ° (336) 763-8833 [email or web address] °  °[facility] [address], [place], [place] (336)723-1848, Ext. 123 Second and Fourth Thursday of each month, opens at 6:30 AM; Clinic ends at 9 AM.  Patients are seen on a first-come first-served basis, and a limited number are seen during each clinic.  ° °[facility] ° [address], [place], [place] (336) 723-7904    Eligibility Requirements °You must have lived in [place], [place], or [place] counties for at least the last three months. °  You cannot be eligible for state or federal sponsored healthcare insurance, including Veterans Administration, Medicaid, or Medicare. °  You generally cannot be eligible for healthcare insurance through your employer.  °  How to apply: °Eligibility screenings are held every Tuesday and Wednesday afternoon from 1:00 pm until 4:00 pm. You do not need an appointment for the interview!  °[facility] [address], [place], [place] 336-631-2330   °[facility]  336-342-8273   °[facility]  336-703-3100   °[facility]  336-570-6415   ° °Behavioral Health Resources in the Community: °Intensive Outpatient Programs °Organization         Address  Phone  Notes  °[facility] [address], [place], [place] 336-878-6098   °[facility] [address], [place], [place] 336-832-9800   °[facility] [address], [place], [place] ° 336-882-2125   °[facility] [address],  °[place], [place] 1-800-853-5163 or 336-641-4981   °Substance Abuse Resources °Organization         Address  Phone  Notes  °[facility]  336-882-2125   °[facility]  336-784-9470   °[facility]  336-285-9073   °[facility]  336-845-3988   °Residential & Outpatient Substance Abuse Program  1-800-659-3381   °Psychological Services °Organization         Address  Phone  Notes  °[facility]  336- 832-9600   °[facility]  336- 378-7881   °[facility] [address], [place] 1-800-853-5163 or 336-641-4981   ° °Mobile Crisis Teams °Organization         Address  Phone  Notes  °[facility], Mobile Crisis Care Unit  1-877-626-1772   °Assertive °[facility] ° [address]  [place], [place] 336-834-9664   °Sharon DeEsch [address] °[place] 336-554-5454   ° °Self-Help/Support Groups °Organization         Address  Phone             Notes  °[facility]  - variety of support groups  336- 373-1402 Call for more information  °Narcotics Anonymous (NA), [facility] [address], °[place]  2 meetings at this location  ° °  Residential Treatment Programs Organization         Address  Phone  Notes  [facility] [address],    [place]  [phone]   [facility]  [address], [place] [phone], [place], [place] [phone]   [facility] [address], [place] [phone] Admissions: 8am-3pm M-F  [facility] [address]    [place], [place] 295-284-1324([phone]) [phone]   [facility] [address], [place], [place] [phone]   [facility] [address]  [place], [place] [phone]   [facility] Int[facility] Outpatient [address], [place], [place] [phone]   [facility] [address]  [place], [place] [phone])[phone] or [phone]   [facility] [address] [place], [place] [phone] Accepts Medicaid  [facility] [address]  [place] [phone] Substance Abuse/Addiction Treatment   [facility] Resources Organization         Address  Phone  Notes  [facility]  772-105-2070(888) [phone]   Angie FavaJulie Brannon, PhD [address], [address] [place], [place]   [phone](336) [phone])[phone] or [phone](336) [phone]   [facility]    [address] [place], [place] [phone](336) [phone]   [facility] [address], [place], [place] ([phone]) [phone](336) [phone] Insurance/Medicaid/sponsorship through [facility] [address]                                    [place], [place] [phone]) [phone] Therapy/tele-psych/case    [facility] [address].   [place], [place] [phone](336) [phone]    Dr. Lolly MustacheArfeen  [phone](336) [phone]   [facility]  [employer] [facility] 1) [address], [place] 2) [address], [place] 3)  [address], [place] [phone](336) [phone](336) [phone]  [phone](336) [phone]   [facility] Child Abuse Hotline [phone](336) (867)087-0077 or 8023747905(336) [phone] (After Hours)      Take the prescriptions as directed.  Increase your fluid intake (ie:  Gatoraide) for the next few days, as discussed.  Eat a bland diet and advance to your regular diet slowly as you can tolerate it.  Call your regular medical doctor today to schedule a follow up appointment this week.  Return to the Emergency Department immediately if not improving (or even worsening) despite taking the medicines as prescribed, any black or bloody stool or vomit, if you develop a fever over "101," or for any other concerns.

## 2015-09-09 NOTE — ED Notes (Signed)
Patient states she is unable to void at this time.  

## 2015-09-09 NOTE — ED Notes (Addendum)
Patient tolerating PO fluids 

## 2015-09-09 NOTE — ED Notes (Signed)
Patient c/o abd pain with n/v since Monday. Denies diarrhea.

## 2015-09-13 ENCOUNTER — Emergency Department (HOSPITAL_COMMUNITY)
Admission: EM | Admit: 2015-09-13 | Discharge: 2015-09-13 | Disposition: A | Discharge status: Home / Self Care | Payer: Medicaid Other | Attending: Emergency Medicine | Admitting: Emergency Medicine

## 2015-09-13 ENCOUNTER — Encounter (HOSPITAL_COMMUNITY): Payer: Self-pay | Admitting: *Deleted

## 2015-09-13 DIAGNOSIS — R112 Nausea with vomiting, unspecified: Secondary | ICD-10-CM

## 2015-09-13 DIAGNOSIS — F419 Anxiety disorder, unspecified: Secondary | ICD-10-CM | POA: Insufficient documentation

## 2015-09-13 DIAGNOSIS — N12 Tubulo-interstitial nephritis, not specified as acute or chronic: Secondary | ICD-10-CM | POA: Insufficient documentation

## 2015-09-13 DIAGNOSIS — F1210 Cannabis abuse, uncomplicated: Secondary | ICD-10-CM | POA: Insufficient documentation

## 2015-09-13 DIAGNOSIS — Z3202 Encounter for pregnancy test, result negative: Secondary | ICD-10-CM | POA: Insufficient documentation

## 2015-09-13 DIAGNOSIS — Z792 Long term (current) use of antibiotics: Secondary | ICD-10-CM | POA: Insufficient documentation

## 2015-09-13 DIAGNOSIS — Z87891 Personal history of nicotine dependence: Secondary | ICD-10-CM | POA: Insufficient documentation

## 2015-09-13 LAB — COMPREHENSIVE METABOLIC PANEL
ALK PHOS: 79 U/L (ref 38–126)
ALT: 16 U/L (ref 14–54)
ANION GAP: 13 (ref 5–15)
AST: 18 U/L (ref 15–41)
Albumin: 3.6 g/dL (ref 3.5–5.0)
BILIRUBIN TOTAL: 0.8 mg/dL (ref 0.3–1.2)
BUN: 5 mg/dL — ABNORMAL LOW (ref 6–20)
CALCIUM: 9.2 mg/dL (ref 8.9–10.3)
CO2: 23 mmol/L (ref 22–32)
Chloride: 98 mmol/L — ABNORMAL LOW (ref 101–111)
Creatinine, Ser: 0.57 mg/dL (ref 0.44–1.00)
GFR calc non Af Amer: >60 mL/min (ref >60)
Glucose, Bld: 106 mg/dL — ABNORMAL HIGH (ref 65–99)
POTASSIUM: 3.1 mmol/L — AB (ref 3.5–5.1)
Sodium: 134 mmol/L — ABNORMAL LOW (ref 135–145)
TOTAL PROTEIN: 7.4 g/dL (ref 6.5–8.1)

## 2015-09-13 LAB — URINALYSIS, ROUTINE W REFLEX MICROSCOPIC
Bilirubin Urine: NEGATIVE
Glucose, UA: NEGATIVE mg/dL
Ketones, ur: >80 mg/dL — AB
NITRITE: NEGATIVE
PROTEIN: NEGATIVE mg/dL
Specific Gravity, Urine: 1.01 (ref 1.005–1.030)
UROBILINOGEN UA: 1 mg/dL (ref 0.0–1.0)
pH: 6.5 (ref 5.0–8.0)

## 2015-09-13 LAB — URINE MICROSCOPIC-ADD ON

## 2015-09-13 LAB — RAPID URINE DRUG SCREEN, HOSP PERFORMED
Amphetamines: NOT DETECTED
BENZODIAZEPINES: NOT DETECTED
Barbiturates: NOT DETECTED
COCAINE: NOT DETECTED
OPIATES: NOT DETECTED
Tetrahydrocannabinol: POSITIVE — AB

## 2015-09-13 LAB — CBC
HEMATOCRIT: 36.4 % (ref 36.0–46.0)
HEMOGLOBIN: 12.5 g/dL (ref 12.0–15.0)
MCH: 28.3 pg (ref 26.0–34.0)
MCHC: 34.3 g/dL (ref 30.0–36.0)
MCV: 82.5 fL (ref 78.0–100.0)
Platelets: 274 10*3/uL (ref 150–400)
RBC: 4.41 MIL/uL (ref 3.87–5.11)
RDW: 13.4 % (ref 11.5–15.5)
WBC: 16.3 10*3/uL — AB (ref 4.0–10.5)

## 2015-09-13 LAB — PREGNANCY, URINE: PREG TEST UR: NEGATIVE

## 2015-09-13 LAB — LIPASE, BLOOD: Lipase: 18 U/L (ref 11–51)

## 2015-09-13 MED ORDER — CEPHALEXIN 500 MG PO CAPS: 500.0000 mg | ORAL_CAPSULE | Freq: Four times a day (QID) | ORAL | Status: DC

## 2015-09-13 MED ORDER — HYDROMORPHONE HCL 1 MG/ML IJ SOLN
0.5000 mg | Freq: Once | INTRAMUSCULAR | Status: AC
  Administered 2015-09-13: 0.5 mg via INTRAVENOUS
  Filled 2015-09-13: qty 1

## 2015-09-13 MED ORDER — POTASSIUM CHLORIDE CRYS ER 20 MEQ PO TBCR
40.0000 meq | EXTENDED_RELEASE_TABLET | Freq: Once | ORAL | Status: AC
  Administered 2015-09-13: 40 meq via ORAL
  Filled 2015-09-13: qty 2

## 2015-09-13 MED ORDER — HYDROCODONE-ACETAMINOPHEN 5-325 MG PO TABS: 1.0000 | ORAL_TABLET | Freq: Four times a day (QID) | ORAL | Status: DC | PRN

## 2015-09-13 MED ORDER — PANTOPRAZOLE SODIUM 40 MG IV SOLR
40.0000 mg | Freq: Once | INTRAVENOUS | Status: AC
  Administered 2015-09-13: 40 mg via INTRAVENOUS
  Filled 2015-09-13: qty 40

## 2015-09-13 MED ORDER — SODIUM CHLORIDE 0.9 % IV BOLUS (SEPSIS)
1000.0000 mL | Freq: Once | INTRAVENOUS | Status: AC
  Administered 2015-09-13: 1000 mL via INTRAVENOUS

## 2015-09-13 MED ORDER — LORAZEPAM 2 MG/ML IJ SOLN
0.5000 mg | Freq: Once | INTRAMUSCULAR | Status: AC
  Administered 2015-09-13: 0.5 mg via INTRAVENOUS
  Filled 2015-09-13: qty 1

## 2015-09-13 MED ORDER — ONDANSETRON 4 MG PO TBDP
4.0000 mg | ORAL_TABLET | Freq: Once | ORAL | Status: AC | PRN
  Administered 2015-09-13: 4 mg via ORAL

## 2015-09-13 MED ORDER — ONDANSETRON 4 MG PO TBDP
ORAL_TABLET | ORAL | Status: AC
  Filled 2015-09-13: qty 1

## 2015-09-13 MED ORDER — ONDANSETRON 8 MG PO TBDP: 8.0000 mg | ORAL_TABLET | Freq: Three times a day (TID) | ORAL | Status: DC | PRN

## 2015-09-13 MED ORDER — DEXTROSE 5 % IV SOLN
1.0000 g | Freq: Once | INTRAVENOUS | Status: AC
  Administered 2015-09-13: 1 g via INTRAVENOUS
  Filled 2015-09-13: qty 10

## 2015-09-13 NOTE — ED Provider Notes (Signed)
CSN: [id number]     Arrival date & time 09/13/15  1447 History   First MD Initiated Contact with Patient 09/13/15 1524     Chief Complaint  Patient presents with  . Emesis     (Consider location/radiation/quality/duration/timing/severity/associated sxs/prior Treatment) Patient is a 19 y.o. female presenting with vomiting. The history is provided by the patient.  Emesis Associated symptoms: no abdominal pain, no chills, no diarrhea, no headaches and no sore throat   Patient c/o recurrent nv x past 5-6 days. Emesis clear to yellowish, not bloody or bilious. Multiple episodes per day. No abd distension. Having normal bms, no diarrhea. C/o epigastric/upper abd pain. No mid to lower abd or pelvic pain. States lnmp 1 month ago, and then started having vaginal bleeding yesterday similar to normal period. No discharge. Notes recent dx uti 4 days ago, but states hasnt taken meds. No dysuria or urgency. No flank pain. Denies fever or chills. No known ill contacts, travel or bad food exposure. Denies hx IBS, IBD, or chronic GI symptoms. No prior abd surgery. Denies cough or uri c/o. No chest pain.       History reviewed. No pertinent past medical history. History reviewed. No pertinent past surgical history. No family history on file. Social History  Substance Use Topics  . Smoking status: Former Games developer  . Smokeless tobacco: None  . Alcohol Use: No   OB History    No data available     Review of Systems  Constitutional: Negative for fever and chills.  HENT: Negative for sore throat.   Eyes: Negative for redness.  Respiratory: Negative for cough and shortness of breath.   Cardiovascular: Negative for chest pain and leg swelling.  Gastrointestinal: Positive for nausea and vomiting. Negative for abdominal pain and diarrhea.  Endocrine: Negative for polyuria.  Genitourinary: Negative for dysuria and flank pain.  Musculoskeletal: Negative for back pain and neck pain.  Skin: Negative for  rash.  Neurological: Negative for headaches.  Hematological: Does not bruise/bleed easily.  Psychiatric/Behavioral: Negative for confusion.      Allergies  Review of patient's allergies indicates no known allergies.  Home Medications   Prior to Admission medications   Medication Sig Start Date End Date Taking? Authorizing Provider  acetaminophen (TYLENOL) 325 MG tablet Take by mouth every 6 (six) hours as needed.    Historical Provider, MD  cephALEXin (KEFLEX) 500 MG capsule Take 1 capsule (500 mg total) by mouth 4 (four) times daily. 09/09/15   Samuel Jester, DO  ondansetron (ZOFRAN) 4 MG tablet Take 1 tablet (4 mg total) by mouth every 8 (eight) hours as needed for nausea or vomiting. 09/09/15   Samuel Jester, DO   BP 126/87 mmHg  Pulse 100  Temp(Src) 98.6 F (37 C) (Oral)  Resp 18  Ht 4' 10.5" (1.486 m)  Wt 169 lb (76.658 kg)  BMI 34.72 kg/m2  SpO2 98%  LMP 09/13/2015 Physical Exam  Constitutional: She is oriented to person, place, and time. She appears well-developed and well-nourished. No distress.  HENT:  Head: Atraumatic.  Mouth/Throat: Oropharynx is clear and moist.  Eyes: Conjunctivae are normal. Pupils are equal, round, and reactive to light. No scleral icterus.  Neck: Neck supple. No tracheal deviation present.  No stiffness or rigidity  Cardiovascular: Normal rate, regular rhythm, normal heart sounds and intact distal pulses.  Exam reveals no friction rub.   No murmur heard. Pulmonary/Chest: Effort normal and breath sounds normal. No respiratory distress.  Abdominal: Soft. Normal appearance and bowel  sounds are normal. She exhibits no distension and no mass. There is tenderness. There is no rebound and no guarding.  No incarc hernia. Mild epigastric tenderness.   Genitourinary:  No cva tenderness  Musculoskeletal: She exhibits no edema or tenderness.  Neurological: She is alert and oriented to person, place, and time.  Speech clear/fluent. Ambulates w  steady gait.   Skin: Skin is warm and dry. No rash noted. She is not diaphoretic.  Psychiatric: She has a normal mood and affect.  Nursing note and vitals reviewed.   ED Course  Procedures (including critical care time) Labs Review  Results for orders placed or performed during the hospital encounter of 09/13/15  Lipase, blood  Result Value Ref Range   Lipase 18 11 - 51 U/L  Comprehensive metabolic panel  Result Value Ref Range   Sodium 134 (L) 135 - 145 mmol/L   Potassium 3.1 (L) 3.5 - 5.1 mmol/L   Chloride 98 (L) 101 - 111 mmol/L   CO2 23 22 - 32 mmol/L   Glucose, Bld 106 (H) 65 - 99 mg/dL   BUN 5 (L) 6 - 20 mg/dL   Creatinine, Ser 4.09 0.44 - 1.00 mg/dL   Calcium 9.2 8.9 - 81.1 mg/dL   Total Protein 7.4 6.5 - 8.1 g/dL   Albumin 3.6 3.5 - 5.0 g/dL   AST 18 15 - 41 U/L   ALT 16 14 - 54 U/L   Alkaline Phosphatase 79 38 - 126 U/L   Total Bilirubin 0.8 0.3 - 1.2 mg/dL   GFR calc non Af Amer >60 >60 mL/min   GFR calc Af Amer >60 >60 mL/min   Anion gap 13 5 - 15  CBC  Result Value Ref Range   WBC 16.3 (H) 4.0 - 10.5 K/uL   RBC 4.41 3.87 - 5.11 MIL/uL   Hemoglobin 12.5 12.0 - 15.0 g/dL   HCT 91.4 78.2 - 95.6 %   MCV 82.5 78.0 - 100.0 fL   MCH 28.3 26.0 - 34.0 pg   MCHC 34.3 30.0 - 36.0 g/dL   RDW 21.3 08.6 - 57.8 %   Platelets 274 150 - 400 K/uL  Urinalysis, Routine w reflex microscopic (not at [facility])  Result Value Ref Range   Color, Urine YELLOW YELLOW   APPearance CLOUDY (A) CLEAR   Specific Gravity, Urine 1.010 1.005 - 1.030   pH 6.5 5.0 - 8.0   Glucose, UA NEGATIVE NEGATIVE mg/dL   Hgb urine dipstick LARGE (A) NEGATIVE   Bilirubin Urine NEGATIVE NEGATIVE   Ketones, ur >80 (A) NEGATIVE mg/dL   Protein, ur NEGATIVE NEGATIVE mg/dL   Urobilinogen, UA 1.0 0.0 - 1.0 mg/dL   Nitrite NEGATIVE NEGATIVE   Leukocytes, UA MODERATE (A) NEGATIVE  Pregnancy, urine  Result Value Ref Range   Preg Test, Ur NEGATIVE NEGATIVE  Urine rapid drug screen (hosp performed)  Result  Value Ref Range   Opiates NONE DETECTED NONE DETECTED   Cocaine NONE DETECTED NONE DETECTED   Benzodiazepines NONE DETECTED NONE DETECTED   Amphetamines NONE DETECTED NONE DETECTED   Tetrahydrocannabinol POSITIVE (A) NONE DETECTED   Barbiturates NONE DETECTED NONE DETECTED  Urine microscopic-add on  Result Value Ref Range   Squamous Epithelial / LPF FEW (A) RARE   WBC, UA TOO NUMEROUS TO COUNT <3 WBC/hpf   RBC / HPF 3-6 <3 RBC/hpf   Bacteria, UA MANY (A) RARE   US Abdomen Complete  09/09/2015  CLINICAL DATA:  Acute onset upper abdominal pain and  nausea and vomiting 2 days ago. EXAM: ULTRASOUND ABDOMEN COMPLETE COMPARISON:  None. FINDINGS: Gallbladder: No gallstones or wall thickening visualized. No sonographic Murphy sign noted. Common bile duct: Diameter: 5 mm, within normal limits. Liver: No focal lesion identified. Within normal limits in parenchymal echo genicity. IVC: No abnormality visualized. Pancreas: Visualized portion unremarkable. Spleen: Size and appearance within normal limits. Right Kidney: Length: 11.1 cm. Echogenicity within normal limits. No mass or hydronephrosis visualized. Left Kidney: Length: 10.8 cm. Echogenicity within normal limits. No mass or hydronephrosis visualized. Abdominal aorta: No aneurysm visualized. Other findings: None. IMPRESSION: Negative abdominal ultrasound. Electronically Signed   By: Myles RosenthalJohn  Stahl M.D.   On: 09/09/2015 14:30       I have personally reviewed and evaluated these images and lab results as part of my medical decision-making.    MDM   Iv ns bolus. Zofran.  Pt c/o epigastric pain. protonix iv. Appears anxious and requests pain med. Dilaudid .5 mg iv. zofran .5 mg iv.   Recent abd u/s for same symptoms, neg for gallstones or acute process.   ua positive - u culture sent - pt notes she did not take any abx from prior recent dx uti (no cx sent that date).  Additional ns iv.  Pt feels improved.  Rocephin iv.  kcl po.  Po  fluids.  Pt continues to feel improved, pain controlled, no nv. abd exam benign, vitals normal.  Pt currently appears stable for d/c.  Return precautions provided.     Cathren LaineKevin Ryle Buscemi, MD 09/13/15 [phone]

## 2015-09-13 NOTE — ED Notes (Signed)
Pt reports n/v onset x 1 wk, reports vomiting 15-20 vomiting episodes in the las 24 hrs, reports abnormal vaginal bleeding, pt c/o dysuria, denies diarrhea, denies hematemesis & coffee ground emesis, reports generalized abd pain worse in the epigastric area, pt seen at [facility] for similar symptoms on 08/09/15, A&O x4

## 2015-09-13 NOTE — ED Notes (Signed)
Pt given water to drink. Pt tolerated well.

## 2015-09-13 NOTE — Discharge Instructions (Signed)
It was our pleasure to provide your ER care today - we hope that you feel better.  Rest. Drink plenty of fluids.  You must take antibiotic (keflex) as prescribed.   The lab tests show a urine infection - a urine culture was sent the results of which will be back in 2 days time - have primary care doctor follow up on that result then.  Take motrin or aleve as need for pain. You may also take hydrocodone as need for pain. No driving when taking hydrocodone. Also, do not take tylenol or acetaminophen containing medication when taking hydrocodone.  Take zofran as need for nausea.  From today's labs, your potassium level is mildly low  - eat plenty of fruits and vegetables, and follow up with primary care doctor in the next 1-2 weeks.  You were given pain medication in the ER that causes drowsiness - no driving for the next 4 hours.  Follow up with primary care doctor for recheck in the next couple days.  Return to ER if worse, new symptoms, worsening or severe abdominal pain, persistent vomiting, high fevers, weak/fainting, other concern.     Pyelonephritis, Adult Pyelonephritis is a kidney infection. The kidneys are the organs that filter a person's blood and move waste out of the bloodstream and into the urine. Urine passes from the kidneys, through the ureters, and into the bladder. There are two main types of pyelonephritis:  Infections that come on quickly without any warning (acute pyelonephritis).  Infections that last for a long period of time (chronic pyelonephritis). In most cases, the infection clears up with treatment and does not cause further problems. More severe infections or chronic infections can sometimes spread to the bloodstream or lead to other problems with the kidneys. CAUSES This condition is usually caused by:  Bacteria traveling from the bladder to the kidney through infected urine. The urine in the bladder can become infected with bacteria from:  Bladder  infection (cystitis).  Inflammation of the prostate gland (prostatitis).  Sexual intercourse, in females.  Bacteria traveling from the bloodstream to the kidney. RISK FACTORS This condition is more likely to develop in:  Pregnant women.  Older people.  People who have diabetes.  People who have kidney stones or bladder stones.  People who have other abnormalities of the kidney or ureter.  People who have a catheter placed in the bladder.  People who have cancer.  People who are sexually active.  Women who use spermicides.  People who have had a prior urinary tract infection. SYMPTOMS Symptoms of this condition include:  Frequent urination.  Strong or persistent urge to urinate.  Burning or stinging when urinating.  Abdominal pain.  Back pain.  Pain in the side or flank area.  Fever.  Chills.  Blood in the urine, or dark urine.  Nausea.  Vomiting. DIAGNOSIS This condition may be diagnosed based on:  Medical history and physical exam.  Urine tests.  Blood tests. You may also have imaging tests of the kidneys, such as an ultrasound or CT scan. TREATMENT Treatment for this condition may depend on the severity of the infection.  If the infection is mild and is found early, you may be treated with antibiotic medicines taken by mouth. You will need to drink fluids to remain hydrated.  If the infection is more severe, you may need to stay in the hospital and receive antibiotics given directly into a vein through an IV tube. You may also need to receive fluids  through an IV tube if you are not able to remain hydrated. After your hospital stay, you may need to take oral antibiotics for a period of time. Other treatments may be required, depending on the cause of the infection. HOME CARE INSTRUCTIONS Medicines  Take over-the-counter and prescription medicines only as told by your health care provider.  If you were prescribed an antibiotic medicine, take  it as told by your health care provider. Do not stop taking the antibiotic even if you start to feel better. General Instructions  Drink enough fluid to keep your urine clear or pale yellow.  Avoid caffeine, tea, and carbonated beverages. They tend to irritate the bladder.  Urinate often. Avoid holding in urine for long periods of time.  Urinate before and after sex.  After a bowel movement, women should cleanse from front to back. Use each tissue only once.  Keep all follow-up visits as told by your health care provider. This is important. SEEK MEDICAL CARE IF:  Your symptoms do not get better after 2 days of treatment.  Your symptoms get worse.  You have a fever. SEEK IMMEDIATE MEDICAL CARE IF:  You are unable to take your antibiotics or fluids.  You have shaking chills.  You vomit.  You have severe flank or back pain.  You have extreme weakness or fainting.   This information is not intended to replace advice given to you by your health care provider. Make sure you discuss any questions you have with your health care provider.   Document Released: 10/24/2005 Document Revised: 07/15/2015 Document Reviewed: 02/16/2015 Elsevier Interactive Patient Education 2016 [employer]  Nausea and Vomiting Nausea is a sick feeling that often comes before throwing up (vomiting). Vomiting is a reflex where stomach contents come out of your mouth. Vomiting can cause severe loss of body fluids (dehydration). Children and elderly adults can become dehydrated quickly, especially if they also have diarrhea. Nausea and vomiting are symptoms of a condition or disease. It is important to find the cause of your symptoms. CAUSES   Direct irritation of the stomach lining. This irritation can result from increased acid production (gastroesophageal reflux disease), infection, food poisoning, taking certain medicines (such as nonsteroidal anti-inflammatory drugs), alcohol use, or tobacco  use.  Signals from the brain.These signals could be caused by a headache, heat exposure, an inner ear disturbance, increased pressure in the brain from injury, infection, a tumor, or a concussion, pain, emotional stimulus, or metabolic problems.  An obstruction in the gastrointestinal tract (bowel obstruction).  Illnesses such as diabetes, hepatitis, gallbladder problems, appendicitis, kidney problems, cancer, sepsis, atypical symptoms of a heart attack, or eating disorders.  Medical treatments such as chemotherapy and radiation.  Receiving medicine that makes you sleep (general anesthetic) during surgery. DIAGNOSIS Your caregiver may ask for tests to be done if the problems do not improve after a few days. Tests may also be done if symptoms are severe or if the reason for the nausea and vomiting is not clear. Tests may include:  Urine tests.  Blood tests.  Stool tests.  Cultures (to look for evidence of infection).  X-rays or other imaging studies. Test results can help your caregiver make decisions about treatment or the need for additional tests. TREATMENT You need to stay well hydrated. Drink frequently but in small amounts.You may wish to drink water, sports drinks, clear broth, or eat frozen ice pops or gelatin dessert to help stay hydrated.When you eat, eating slowly may help prevent nausea.There are  also some antinausea medicines that may help prevent nausea. HOME CARE INSTRUCTIONS   Take all medicine as directed by your caregiver.  If you do not have an appetite, do not force yourself to eat. However, you must continue to drink fluids.  If you have an appetite, eat a normal diet unless your caregiver tells you differently.  Eat a variety of complex carbohydrates (rice, wheat, potatoes, bread), lean meats, yogurt, fruits, and vegetables.  Avoid high-fat foods because they are more difficult to digest.  Drink enough water and fluids to keep your urine clear or pale  yellow.  If you are dehydrated, ask your caregiver for specific rehydration instructions. Signs of dehydration may include:  Severe thirst.  Dry lips and mouth.  Dizziness.  Dark urine.  Decreasing urine frequency and amount.  Confusion.  Rapid breathing or pulse. SEEK IMMEDIATE MEDICAL CARE IF:   You have blood or brown flecks (like coffee grounds) in your vomit.  You have black or bloody stools.  You have a severe headache or stiff neck.  You are confused.  You have severe abdominal pain.  You have chest pain or trouble breathing.  You do not urinate at least once every 8 hours.  You develop cold or clammy skin.  You continue to vomit for longer than 24 to 48 hours.  You have a fever. MAKE SURE YOU:   Understand these instructions.  Will watch your condition.  Will get help right away if you are not doing well or get worse.   This information is not intended to replace advice given to you by your health care provider. Make sure you discuss any questions you have with your health care provider.   Document Released: 10/24/2005 Document Revised: 01/16/2012 Document Reviewed: 03/23/2011 Elsevier Interactive Patient Education 2016 [employer].   Hypokalemia Hypokalemia means that the amount of potassium in the blood is lower than normal.Potassium is a chemical, called an electrolyte, that helps regulate the amount of fluid in the body. It also stimulates muscle contraction and helps nerves function properly.Most of the body's potassium is inside of cells, and only a very small amount is in the blood. Because the amount in the blood is so small, minor changes can be life-threatening. CAUSES  Antibiotics.  Diarrhea or vomiting.  Using laxatives too much, which can cause diarrhea.  Chronic kidney disease.  Water pills (diuretics).  Eating disorders (bulimia).  Low magnesium level.  Sweating a lot. SIGNS AND  SYMPTOMS  Weakness.  Constipation.  Fatigue.  Muscle cramps.  Mental confusion.  Skipped heartbeats or irregular heartbeat (palpitations).  Tingling or numbness. DIAGNOSIS  Your health care provider can diagnose hypokalemia with blood tests. In addition to checking your potassium level, your health care provider may also check other lab tests. TREATMENT Hypokalemia can be treated with potassium supplements taken by mouth or adjustments in your current medicines. If your potassium level is very low, you may need to get potassium through a vein (IV) and be monitored in the hospital. A diet high in potassium is also helpful. Foods high in potassium are:  Nuts, such as peanuts and pistachios.  Seeds, such as sunflower seeds and pumpkin seeds.  Peas, lentils, and lima beans.  Whole grain and bran cereals and breads.  Fresh fruit and vegetables, such as apricots, avocado, bananas, cantaloupe, kiwi, oranges, tomatoes, asparagus, and potatoes.  Orange and tomato juices.  Red meats.  Fruit yogurt. HOME CARE INSTRUCTIONS  Take all medicines as prescribed by your health  care provider.  Maintain a healthy diet by including nutritious food, such as fruits, vegetables, nuts, whole grains, and lean meats.  If you are taking a laxative, be sure to follow the directions on the label. SEEK MEDICAL CARE IF:  Your weakness gets worse.  You feel your heart pounding or racing.  You are vomiting or having diarrhea.  You are diabetic and having trouble keeping your blood glucose in the normal range. SEEK IMMEDIATE MEDICAL CARE IF:  You have chest pain, shortness of breath, or dizziness.  You are vomiting or having diarrhea for more than 2 days.  You faint. MAKE SURE YOU:   Understand these instructions.  Will watch your condition.  Will get help right away if you are not doing well or get worse.   This information is not intended to replace advice given to you by your health  care provider. Make sure you discuss any questions you have with your health care provider.   Document Released: 10/24/2005 Document Revised: 11/14/2014 Document Reviewed: 04/26/2013 Elsevier Interactive Patient Education [employer]

## 2015-09-15 LAB — URINE CULTURE
Culture: >=100000
Special Requests: NORMAL

## 2015-09-16 ENCOUNTER — Telehealth (HOSPITAL_BASED_OUTPATIENT_CLINIC_OR_DEPARTMENT_OTHER): Payer: Self-pay | Admitting: Emergency Medicine

## 2015-09-16 NOTE — Telephone Encounter (Signed)
Post ED Visit - Positive Culture Follow-up  Culture report reviewed by antimicrobial stewardship pharmacist:  []  Kelsey Lambert, Pharm.D. []  Kelsey Lambert, Pharm.D., BCPS []  Kelsey Lambert, Pharm.D. []  Kelsey Lambert, Pharm.D., BCPS []  Kelsey Lambert, [address] BCPS, AAHIVP []  Kelsey Lambert, Pharm.D., BCPS, AAHIVP []  Kelsey Lambert, Pharm.D. [x]  Kelsey Lambert, [address]  Positive urine culture E. Coli Treated with cephalexin, organism sensitive to the same and no further patient follow-up is required at this time.  Kelsey Lambert, Kelsey Lambert 09/16/2015, 11:29 AM

## 2016-11-01 ENCOUNTER — Encounter (HOSPITAL_COMMUNITY): Payer: Self-pay | Admitting: Emergency Medicine

## 2016-11-01 ENCOUNTER — Emergency Department (HOSPITAL_COMMUNITY)
Admission: EM | Admit: 2016-11-01 | Discharge: 2016-11-01 | Disposition: A | Discharge status: Home / Self Care | Payer: Medicaid Other | Attending: Emergency Medicine | Admitting: Emergency Medicine

## 2016-11-01 DIAGNOSIS — Z79899 Other long term (current) drug therapy: Secondary | ICD-10-CM | POA: Insufficient documentation

## 2016-11-01 DIAGNOSIS — Z87891 Personal history of nicotine dependence: Secondary | ICD-10-CM | POA: Insufficient documentation

## 2016-11-01 DIAGNOSIS — N3001 Acute cystitis with hematuria: Secondary | ICD-10-CM | POA: Insufficient documentation

## 2016-11-01 LAB — URINALYSIS, ROUTINE W REFLEX MICROSCOPIC
BILIRUBIN URINE: NEGATIVE
Glucose, UA: NEGATIVE mg/dL
KETONES UR: NEGATIVE mg/dL
NITRITE: NEGATIVE
Protein, ur: 100 mg/dL — AB
SPECIFIC GRAVITY, URINE: 1.025 (ref 1.005–1.030)
pH: 7 (ref 5.0–8.0)

## 2016-11-01 LAB — URINALYSIS, MICROSCOPIC (REFLEX)

## 2016-11-01 LAB — PREGNANCY, URINE: Preg Test, Ur: NEGATIVE

## 2016-11-01 MED ORDER — PHENAZOPYRIDINE HCL 100 MG PO TABS
200.0000 mg | ORAL_TABLET | Freq: Once | ORAL | Status: AC
  Administered 2016-11-01: 200 mg via ORAL
  Filled 2016-11-01: qty 2

## 2016-11-01 MED ORDER — PHENAZOPYRIDINE HCL 200 MG PO TABS: 200.0000 mg | ORAL_TABLET | Freq: Three times a day (TID) | ORAL | 0 refills | Status: DC

## 2016-11-01 MED ORDER — ONDANSETRON HCL 4 MG PO TABS: 4.0000 mg | ORAL_TABLET | Freq: Three times a day (TID) | ORAL | 0 refills | Status: DC | PRN

## 2016-11-01 MED ORDER — CEPHALEXIN 500 MG PO CAPS: 500.0000 mg | ORAL_CAPSULE | Freq: Three times a day (TID) | ORAL | 0 refills | Status: DC

## 2016-11-01 NOTE — Discharge Instructions (Signed)
Drink plenty of fluids. Take the medications as prescribed.  Recheck if you get a fever, pain in your flank, uncontrolled vomiting.

## 2016-11-01 NOTE — ED Notes (Signed)
Call to lab re: urine preg

## 2016-11-01 NOTE — ED Notes (Signed)
Dr Knapp in to assess 

## 2016-11-01 NOTE — ED Triage Notes (Signed)
Pt c/o dysuria and hematuria since this am.

## 2016-11-01 NOTE — ED Provider Notes (Signed)
[facility]-EMERGENCY DEPT Provider Note   CSN: [id number] Arrival date & time: 11/01/16  0548  Time seen 06:10 AM   History   Chief Complaint Chief Complaint  Patient presents with  . Dysuria    HPI Kelsey Lambert is a 20 y.o. female.  HPI  patient reports about 3 AM she woke up to use the bathroom and since then she has had dysuria, frequency, urgency with accidents because she couldn't get to the bathroom on time. She complains of lower abdominal pain. She had has nausea without vomiting or fever. She denies any flank or lower back pain. She states the first 2 times she went to the bathroom after 3 AM she saw blood in the urine but not since. She states she's had these symptoms before the PPIs. She states she normally has them about twice a year.  PCP none  History reviewed. No pertinent past medical history.  There are no active problems to display for this patient.   History reviewed. No pertinent surgical history.  OB History    No data available       Home Medications    Prior to Admission medications   Medication Sig Start Date End Date Taking? Authorizing Provider  acetaminophen (TYLENOL) 325 MG tablet Take by mouth every 6 (six) hours as needed.    Historical Provider, MD  cephALEXin (KEFLEX) 500 MG capsule Take 1 capsule (500 mg total) by mouth 3 (three) times daily. 11/01/16   Devoria AlbeIva Adysen Raphael, MD  HYDROcodone-acetaminophen (NORCO/VICODIN) 5-325 MG tablet Take 1-2 tablets by mouth every 6 (six) hours as needed for moderate pain. 09/13/15   Cathren LaineKevin Steinl, MD  ibuprofen (ADVIL,MOTRIN) 200 MG tablet Take 200 mg by mouth every 6 (six) hours as needed for cramping.    Historical Provider, MD  ondansetron (ZOFRAN) 4 MG tablet Take 1 tablet (4 mg total) by mouth every 8 (eight) hours as needed for nausea or vomiting. 11/01/16   Devoria AlbeIva Marshaun Lortie, MD  phenazopyridine (PYRIDIUM) 200 MG tablet Take 1 tablet (200 mg total) by mouth 3 (three) times daily. 11/01/16   Devoria AlbeIva Jniyah Dantuono, MD    Family  History History reviewed. No pertinent family history.  Social History Social History  Substance Use Topics  . Smoking status: Former Games developermoker  . Smokeless tobacco: Never Used  . Alcohol use No  unemployed   Allergies   Patient has no known allergies.   Review of Systems Review of Systems  All other systems reviewed and are negative.    Physical Exam Updated Vital Signs BP 126/94 (BP Location: Right Arm)   Pulse 86   Temp 98 F (36.7 C) (Oral)   Resp 19   Ht 4\' 10"  (1.473 m)   Wt 150 lb (68 kg)   LMP 10/24/2016   SpO2 98%   BMI 31.35 kg/m   Vital signs normal    Physical Exam  Constitutional: She is oriented to person, place, and time. She appears well-developed and well-nourished.  HENT:  Head: Normocephalic and atraumatic.  Right Ear: External ear normal.  Left Ear: External ear normal.  Nose: Nose normal.  Mouth/Throat: Oropharynx is clear and moist.  Eyes: Conjunctivae and EOM are normal. Pupils are equal, round, and reactive to light.  Neck: Normal range of motion. Neck supple.  Cardiovascular: Normal rate, regular rhythm and normal heart sounds.   Pulmonary/Chest: Effort normal and breath sounds normal. No respiratory distress. She has no wheezes. She has no rales. She exhibits no tenderness.  Abdominal: Soft.  Bowel sounds are normal.  She has mild diffuse tenderness that is very tender over the suprapubic area.  Genitourinary:  Genitourinary Comments: No CVA tenderness  Musculoskeletal: Normal range of motion.  Neurological: She is alert and oriented to person, place, and time.  Skin: Skin is warm and dry.  Psychiatric: She has a normal mood and affect. Her behavior is normal. Thought content normal.     ED Treatments / Results  Labs (all labs ordered are listed, but only abnormal results are displayed) Results for orders placed or performed during the hospital encounter of 11/01/16  Urinalysis, Routine w reflex microscopic  Result Value Ref  Range   Color, Urine YELLOW YELLOW   APPearance CLEAR CLEAR   Specific Gravity, Urine 1.025 1.005 - 1.030   pH 7.0 5.0 - 8.0   Glucose, UA NEGATIVE NEGATIVE mg/dL   Hgb urine dipstick LARGE (A) NEGATIVE   Bilirubin Urine NEGATIVE NEGATIVE   Ketones, ur NEGATIVE NEGATIVE mg/dL   Protein, ur 865100 (A) NEGATIVE mg/dL   Nitrite NEGATIVE NEGATIVE   Leukocytes, UA SMALL (A) NEGATIVE  Urinalysis, Microscopic (reflex)  Result Value Ref Range   RBC / HPF TOO NUMEROUS TO COUNT 0 - 5 RBC/hpf   WBC, UA TOO NUMEROUS TO COUNT 0 - 5 WBC/hpf   Bacteria, UA MANY (A) NONE SEEN   Squamous Epithelial / LPF TOO NUMEROUS TO COUNT (A) NONE SEEN  Pregnancy, urine  Result Value Ref Range   Preg Test, Ur NEGATIVE NEGATIVE   Laboratory interpretation all normal except UTI   Procedures Procedures (including critical care time)  Medications Ordered in ED Medications  phenazopyridine (PYRIDIUM) tablet 200 mg (200 mg Oral Given 11/01/16 [id number])     Initial Impression / Assessment and Plan / ED Course  I have reviewed the triage vital signs and the nursing notes.  Pertinent labs & imaging results that were available during my care of the patient were reviewed by me and considered in my medical decision making (see chart for details).  Clinical Course    Patient was started on Pyridium for her urinary symptoms. She was discharged with cephalexin for her UTI.  Final Clinical Impressions(s) / ED Diagnoses   Final diagnoses:  Acute cystitis with hematuria    New Prescriptions New Prescriptions   CEPHALEXIN (KEFLEX) 500 MG CAPSULE    Take 1 capsule (500 mg total) by mouth 3 (three) times daily.   ONDANSETRON (ZOFRAN) 4 MG TABLET    Take 1 tablet (4 mg total) by mouth every 8 (eight) hours as needed for nausea or vomiting.   PHENAZOPYRIDINE (PYRIDIUM) 200 MG TABLET    Take 1 tablet (200 mg total) by mouth 3 (three) times daily.    Plan discharge   Devoria AlbeIva Ramelo Oetken, MD, [place] PyoFACEP    Fina Heizer, MD 11/01/16  [phone]

## 2016-11-01 NOTE — ED Notes (Signed)
Pt reports several hours of urgency, and painful urine. Urine collected (less than 10ccs) To the lab

## 2017-03-04 ENCOUNTER — Encounter (HOSPITAL_COMMUNITY): Payer: Self-pay

## 2017-03-04 ENCOUNTER — Emergency Department (HOSPITAL_COMMUNITY)
Admission: EM | Admit: 2017-03-04 | Discharge: 2017-03-04 | Disposition: A | Discharge status: Home / Self Care | Payer: Medicaid Other | Attending: Emergency Medicine | Admitting: Emergency Medicine

## 2017-03-04 DIAGNOSIS — Z79899 Other long term (current) drug therapy: Secondary | ICD-10-CM | POA: Insufficient documentation

## 2017-03-04 DIAGNOSIS — Z87891 Personal history of nicotine dependence: Secondary | ICD-10-CM | POA: Insufficient documentation

## 2017-03-04 DIAGNOSIS — R1084 Generalized abdominal pain: Secondary | ICD-10-CM | POA: Insufficient documentation

## 2017-03-04 DIAGNOSIS — R112 Nausea with vomiting, unspecified: Secondary | ICD-10-CM | POA: Insufficient documentation

## 2017-03-04 DIAGNOSIS — R197 Diarrhea, unspecified: Secondary | ICD-10-CM | POA: Insufficient documentation

## 2017-03-04 LAB — PREGNANCY, URINE: Preg Test, Ur: NEGATIVE

## 2017-03-04 LAB — CBC
HEMATOCRIT: 45.6 % (ref 36.0–46.0)
HEMOGLOBIN: 15.7 g/dL — AB (ref 12.0–15.0)
MCH: 29.6 pg (ref 26.0–34.0)
MCHC: 34.4 g/dL (ref 30.0–36.0)
MCV: 85.9 fL (ref 78.0–100.0)
Platelets: 213 10*3/uL (ref 150–400)
RBC: 5.31 MIL/uL — ABNORMAL HIGH (ref 3.87–5.11)
RDW: 12.9 % (ref 11.5–15.5)
WBC: 14.8 10*3/uL — ABNORMAL HIGH (ref 4.0–10.5)

## 2017-03-04 LAB — COMPREHENSIVE METABOLIC PANEL
ALBUMIN: 5.2 g/dL — AB (ref 3.5–5.0)
ALT: 13 U/L — ABNORMAL LOW (ref 14–54)
ANION GAP: 13 (ref 5–15)
AST: 22 U/L (ref 15–41)
Alkaline Phosphatase: 52 U/L (ref 38–126)
BILIRUBIN TOTAL: 0.8 mg/dL (ref 0.3–1.2)
BUN: 22 mg/dL — ABNORMAL HIGH (ref 6–20)
CHLORIDE: 106 mmol/L (ref 101–111)
CO2: 24 mmol/L (ref 22–32)
Calcium: 10.3 mg/dL (ref 8.9–10.3)
Creatinine, Ser: 0.69 mg/dL (ref 0.44–1.00)
GFR calc Af Amer: >60 mL/min (ref >60)
GFR calc non Af Amer: >60 mL/min (ref >60)
GLUCOSE: 118 mg/dL — AB (ref 65–99)
POTASSIUM: 3.4 mmol/L — AB (ref 3.5–5.1)
Sodium: 143 mmol/L (ref 135–145)
TOTAL PROTEIN: 8.2 g/dL — AB (ref 6.5–8.1)

## 2017-03-04 LAB — URINALYSIS, ROUTINE W REFLEX MICROSCOPIC
Bilirubin Urine: NEGATIVE
GLUCOSE, UA: NEGATIVE mg/dL
Hgb urine dipstick: NEGATIVE
Ketones, ur: 80 mg/dL — AB
LEUKOCYTES UA: NEGATIVE
NITRITE: NEGATIVE
PROTEIN: NEGATIVE mg/dL
Specific Gravity, Urine: 1.015 (ref 1.005–1.030)
pH: 8 (ref 5.0–8.0)

## 2017-03-04 LAB — LIPASE, BLOOD: LIPASE: 22 U/L (ref 11–51)

## 2017-03-04 LAB — RAPID URINE DRUG SCREEN, HOSP PERFORMED
AMPHETAMINES: NOT DETECTED
BARBITURATES: NOT DETECTED
Benzodiazepines: NOT DETECTED
COCAINE: NOT DETECTED
OPIATES: NOT DETECTED
TETRAHYDROCANNABINOL: POSITIVE — AB

## 2017-03-04 LAB — MAGNESIUM: Magnesium: 1.5 mg/dL — ABNORMAL LOW (ref 1.7–2.4)

## 2017-03-04 MED ORDER — LACTATED RINGERS IV BOLUS (SEPSIS)
2000.0000 mL | Freq: Once | INTRAVENOUS | Status: AC
  Administered 2017-03-04: 2000 mL via INTRAVENOUS

## 2017-03-04 MED ORDER — ONDANSETRON HCL 4 MG/2ML IJ SOLN
4.0000 mg | Freq: Once | INTRAMUSCULAR | Status: AC | PRN
  Administered 2017-03-04: 4 mg via INTRAVENOUS
  Filled 2017-03-04: qty 2

## 2017-03-04 MED ORDER — MAGNESIUM OXIDE 400 (241.3 MG) MG PO TABS
ORAL_TABLET | ORAL | Status: AC
  Filled 2017-03-04: qty 2

## 2017-03-04 MED ORDER — LACTATED RINGERS IV BOLUS (SEPSIS): 1000.0000 mL | Freq: Once | INTRAVENOUS | Status: DC

## 2017-03-04 MED ORDER — PROMETHAZINE HCL 25 MG RE SUPP: 25.0000 mg | Freq: Four times a day (QID) | RECTAL | 0 refills | Status: DC | PRN

## 2017-03-04 MED ORDER — MAGNESIUM OXIDE 400 (241.3 MG) MG PO TABS
800.0000 mg | ORAL_TABLET | Freq: Once | ORAL | Status: AC
Start: 2017-03-04 — End: 2017-03-04
  Administered 2017-03-04: 800 mg via ORAL
  Filled 2017-03-04: qty 2

## 2017-03-04 MED ORDER — PROMETHAZINE HCL 25 MG/ML IJ SOLN
25.0000 mg | Freq: Once | INTRAMUSCULAR | Status: AC
  Administered 2017-03-04: 25 mg via INTRAVENOUS
  Filled 2017-03-04: qty 1

## 2017-03-04 MED ORDER — FENTANYL CITRATE (PF) 100 MCG/2ML IJ SOLN
50.0000 ug | Freq: Once | INTRAMUSCULAR | Status: AC
  Administered 2017-03-04: 50 ug via INTRAVENOUS
  Filled 2017-03-04: qty 2

## 2017-03-04 MED ORDER — ONDANSETRON 4 MG PO TBDP: 4.0000 mg | ORAL_TABLET | Freq: Three times a day (TID) | ORAL | 0 refills | Status: DC | PRN

## 2017-03-04 NOTE — ED Notes (Signed)
Pt alert & oriented x4, stable gait. Patient given discharge instructions, paperwork & prescription(s). Patient  instructed to stop at the registration desk to finish any additional paperwork. Patient verbalized understanding. Pt left department w/ no further questions. 

## 2017-03-04 NOTE — Discharge Instructions (Signed)
Hydrate well. Please return to the ER if your symptoms worsen; you have increased pain, fevers, chills, inability to keep any medications down, confusion.

## 2017-03-04 NOTE — ED Triage Notes (Signed)
Pt c/o n/v/d and pain all over since 8am today.  Pt appears pale.

## 2017-03-04 NOTE — ED Provider Notes (Signed)
[facility]-EMERGENCY DEPT Provider Note   CSN: [id number] Arrival date & time: 03/04/17  1755     History   Chief Complaint Chief Complaint  Patient presents with  . Emesis    HPI Kelsey Lambert is a 21 y.o. female.  HPI Pt comes in with cc of nausea and emesis. Pt reports that she woke up this morning with nausea, emesis, diarrhea and abd pain. Her abd pain is in the epigastric and patient has associated nausea, emesis, diarrhea. She reports multiple episodes of emesis, bilious, non bloody. Pt also had a few loose BM. No suspicious po intake. Patient has no pain with urination, blood in the urine, or frequent urination. Pt also denies any vaginal discharge or bleeding.   History reviewed. No pertinent past medical history.  There are no active problems to display for this patient.   History reviewed. No pertinent surgical history.  OB History    No data available       Home Medications    Prior to Admission medications   Medication Sig Start Date End Date Taking? Authorizing Provider  acetaminophen (TYLENOL) 500 MG tablet Take 1,000 mg by mouth every 6 (six) hours as needed for mild pain, moderate pain, fever or headache.   Yes Historical Provider, MD  ibuprofen (ADVIL,MOTRIN) 200 MG tablet Take 400 mg by mouth every 6 (six) hours as needed for fever, headache, mild pain, moderate pain or cramping.    Yes Historical Provider, MD  ondansetron (ZOFRAN ODT) 4 MG disintegrating tablet Take 1 tablet (4 mg total) by mouth every 8 (eight) hours as needed for nausea or vomiting. 03/04/17   Derwood Kaplan, MD  promethazine (PHENERGAN) 25 MG suppository Place 1 suppository (25 mg total) rectally every 6 (six) hours as needed for nausea. 03/04/17   Derwood Kaplan, MD    Family History No family history on file.  Social History Social History  Substance Use Topics  . Smoking status: Former Games developer  . Smokeless tobacco: Never Used  . Alcohol use No     Allergies   Patient has  no known allergies.   Review of Systems Review of Systems  Constitutional: Positive for activity change.  Gastrointestinal: Positive for abdominal pain, nausea and vomiting.  All other systems reviewed and are negative.     Physical Exam Updated Vital Signs BP 131/87 (BP Location: Left Arm)   Pulse 100   Temp 97.7 F (36.5 C) (Oral)   Resp 16   Ht  (1.473 m)   Wt 155 lb (70.3 kg)   LMP 02/19/2017   SpO2 100%   BMI 32.40 kg/m   Physical Exam  Constitutional: She is oriented to person, place, and time. She appears well-developed and well-nourished.  HENT:  Head: Normocephalic and atraumatic.  Eyes: EOM are normal. Pupils are equal, round, and reactive to light.  Neck: Neck supple.  Cardiovascular: Normal rate, regular rhythm and normal heart sounds.   No murmur heard. Pulmonary/Chest: Effort normal. No respiratory distress.  Abdominal: Soft. She exhibits no distension. There is tenderness. There is no rebound and no guarding.  Generalized abd tenderness  Neurological: She is alert and oriented to person, place, and time.  Skin: Skin is warm and dry.  Nursing note and vitals reviewed.    ED Treatments / Results  Labs (all labs ordered are listed, but only abnormal results are displayed) Labs Reviewed  URINALYSIS, ROUTINE W REFLEX MICROSCOPIC - Abnormal; Notable for the following:  Result Value   Ketones, ur 80 (*)    All other components within normal limits  COMPREHENSIVE METABOLIC PANEL - Abnormal; Notable for the following:    Potassium 3.4 (*)    Glucose, Bld 118 (*)    BUN 22 (*)    Total Protein 8.2 (*)    Albumin 5.2 (*)    ALT 13 (*)    All other components within normal limits  CBC - Abnormal; Notable for the following:    WBC 14.8 (*)    RBC 5.31 (*)    Hemoglobin 15.7 (*)    All other components within normal limits  MAGNESIUM - Abnormal; Notable for the following:    Magnesium 1.5 (*)    All other components within normal limits    RAPID URINE DRUG SCREEN, HOSP PERFORMED - Abnormal; Notable for the following:    Tetrahydrocannabinol POSITIVE (*)    All other components within normal limits  LIPASE, BLOOD  PREGNANCY, URINE    EKG  EKG Interpretation None       Radiology No results found.  Procedures Procedures (including critical care time)  Medications Ordered in ED Medications  ondansetron (ZOFRAN) injection 4 mg (4 mg Intravenous Given 03/04/17 1843)  fentaNYL (SUBLIMAZE) injection 50 mcg (50 mcg Intravenous Given 03/04/17 1913)  lactated ringers bolus 2,000 mL (0 mLs Intravenous Stopped 03/04/17 2140)  magnesium oxide (MAG-OX) tablet 800 mg (800 mg Oral Given 03/04/17 2123)  promethazine (PHENERGAN) injection 25 mg (25 mg Intravenous Given 03/04/17 2015)     Initial Impression / Assessment and Plan / ED Course  I have reviewed the triage vital signs and the nursing notes.  Pertinent labs & imaging results that were available during my care of the patient were reviewed by me and considered in my medical decision making (see chart for details).  Clinical Course as of Mar 06 1919  Sat Mar 04, 2017  2030 Pt reassessed. Pt's VSS and WNL. Pt's cap refill < 3 seconds. Pt has been hydrated in the ER and now passed po challenge. We will discharge with antiemetic. Strict ER return precautions have been discussed and pt will return if he is unable to tolerate fluids and symptoms are getting worse.   [AN]    Clinical Course User Index [AN] Derwood Kaplan, MD    Pt with abd pain, n/v/diarrhea. No recent antibiotics use. No bloody stools, no fevers. Pt has hx of uti/pyelo - but it doesn't appear that she has that based on hx and UA. Ketonuria seen - we will hydrate. Po challenge started.  Final Clinical Impressions(s) / ED Diagnoses   Final diagnoses:  Nausea vomiting and diarrhea  Hypomagnesemia    New Prescriptions Discharge Medication List as of 03/04/2017 10:26 PM    START taking these  medications   Details  ondansetron (ZOFRAN ODT) 4 MG disintegrating tablet Take 1 tablet (4 mg total) by mouth every 8 (eight) hours as needed for nausea or vomiting., Starting Sat 03/04/2017, Print    promethazine (PHENERGAN) 25 MG suppository Place 1 suppository (25 mg total) rectally every 6 (six) hours as needed for nausea., Starting Sat 03/04/2017, Print         Derwood Kaplan, MD 03/05/17 1920

## 2017-07-15 IMAGING — US US ABDOMEN COMPLETE
1 series · 14 of 25 positions shown · non-contrast
Comparison: None.

CLINICAL DATA: Acute onset upper abdominal pain and nausea and
vomiting 2 days ago.

EXAM:
ULTRASOUND ABDOMEN COMPLETE

[Series 1: us abdomen complete · 0.19mm/px · 14 of 83 slices shown]
[im 1/83]
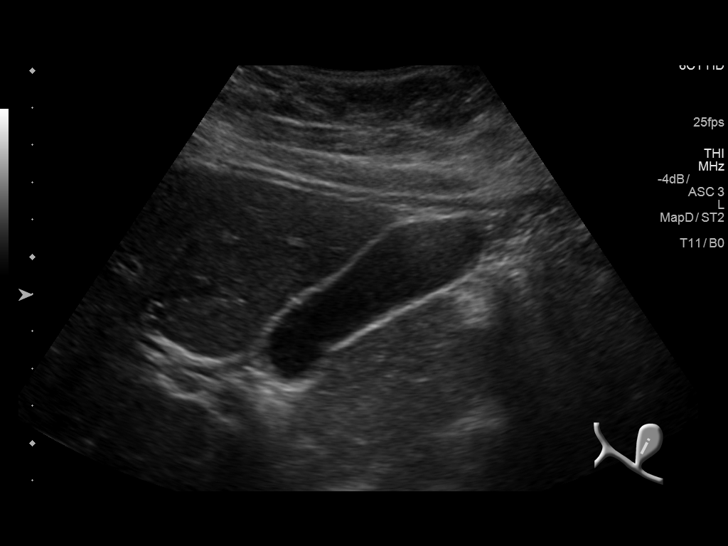
[im 7/83]
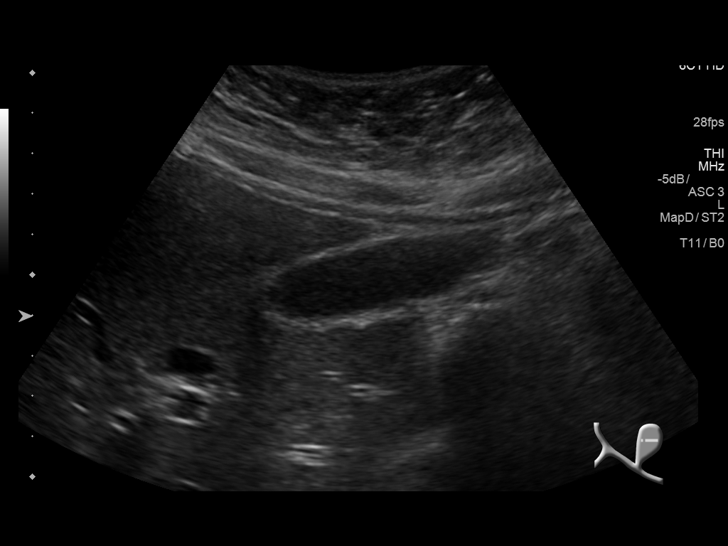
[im 14/83]
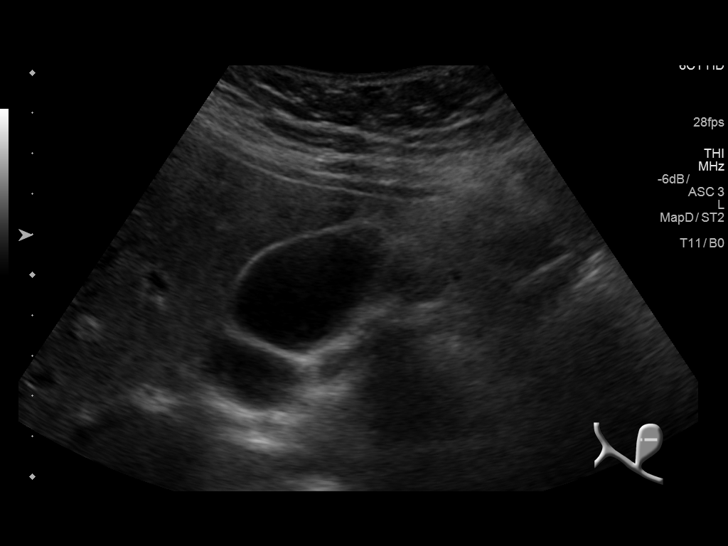
[im 21/83]
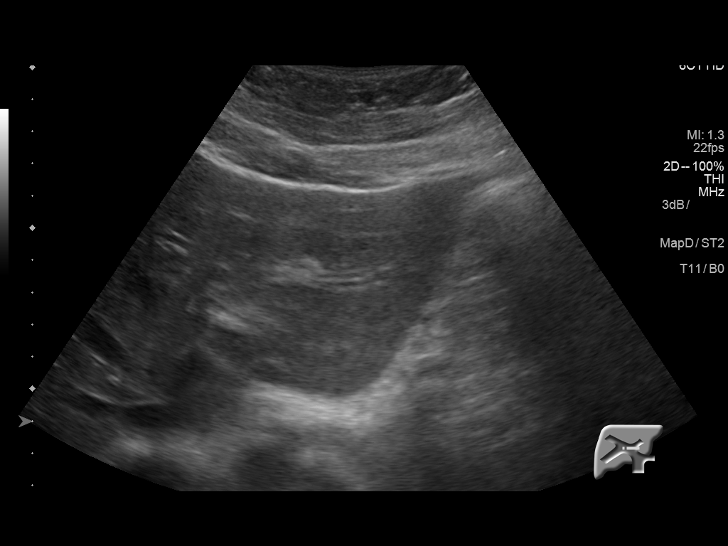
[im 28/83]
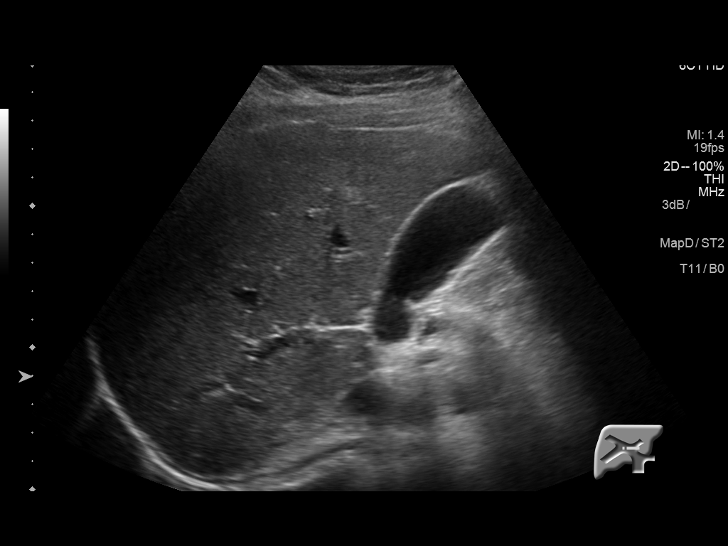
[im 31/83]
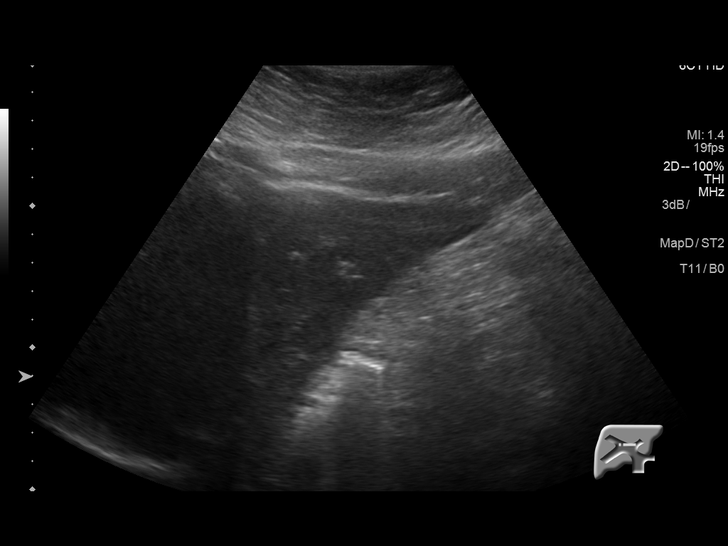
[im 38/83]
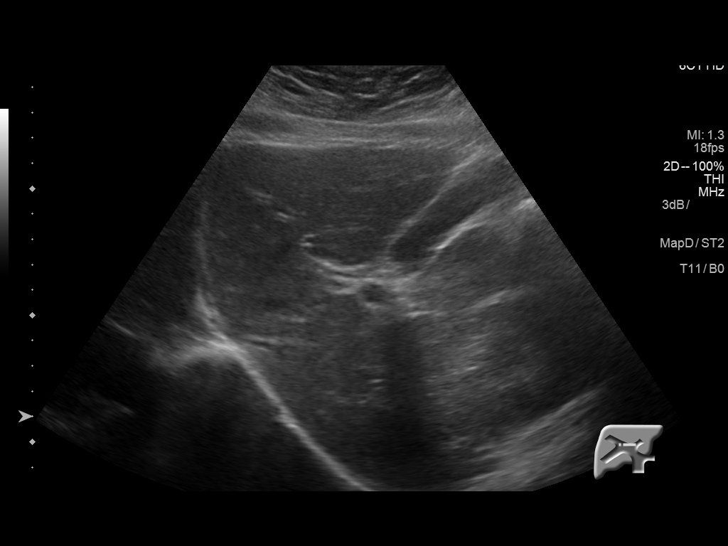
[im 45/83]
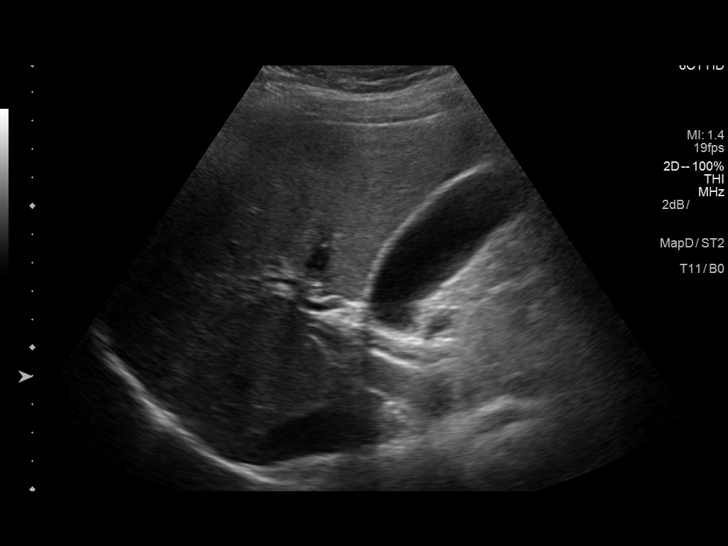
[im 52/83]
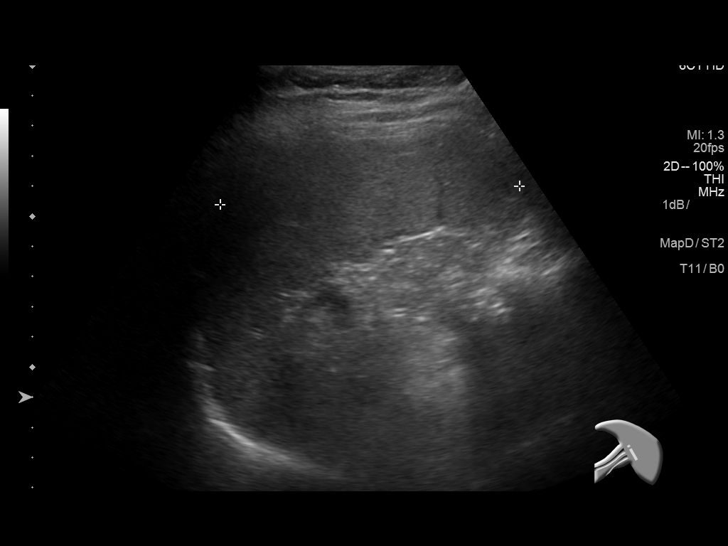
[im 55/83]
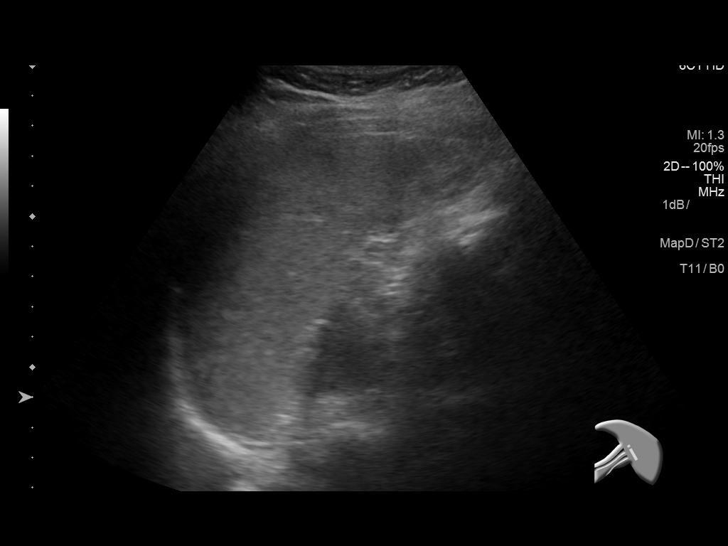
[im 62/83]
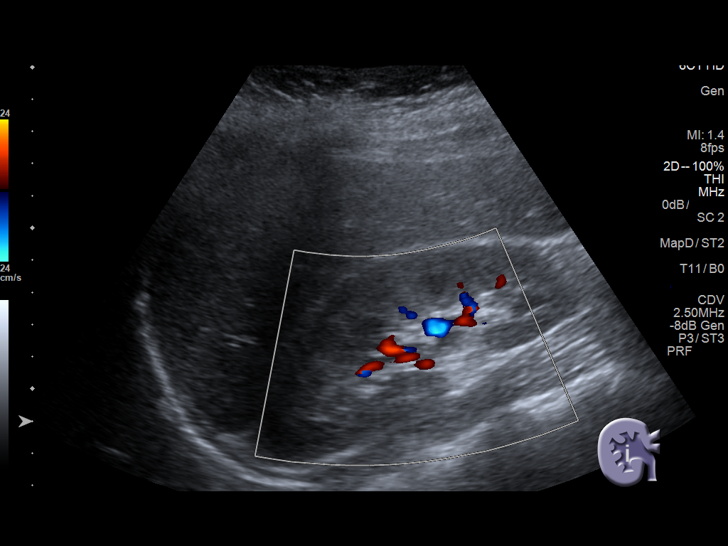
[im 69/83]
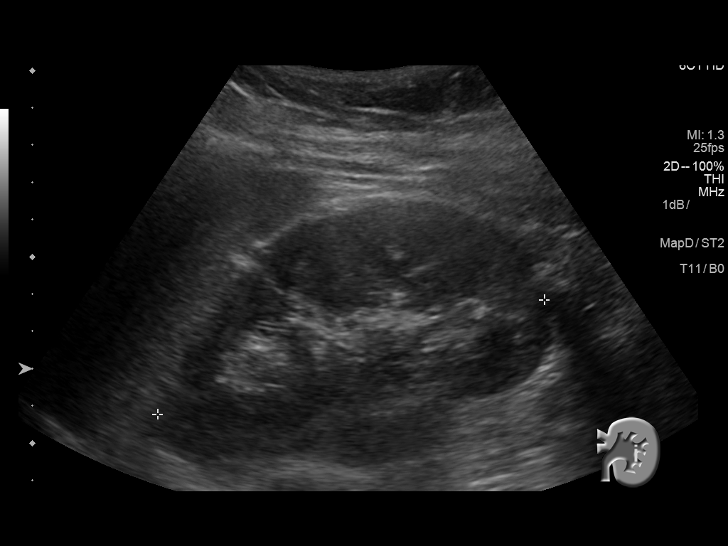
[im 76/83]
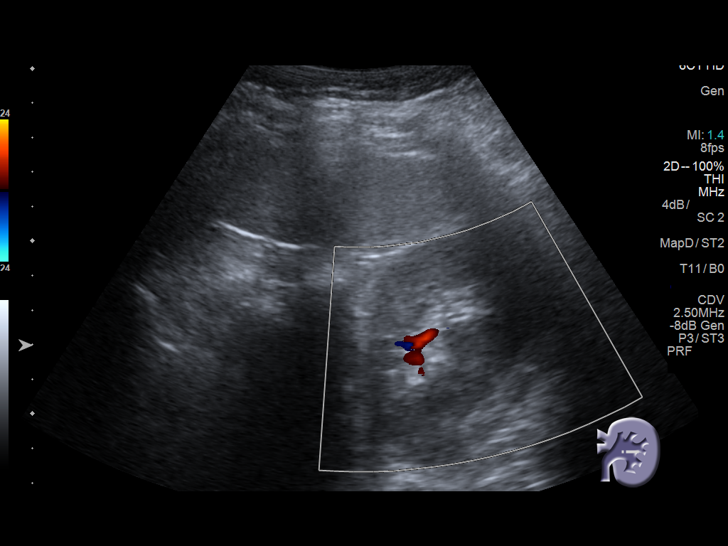
[im 83/83]
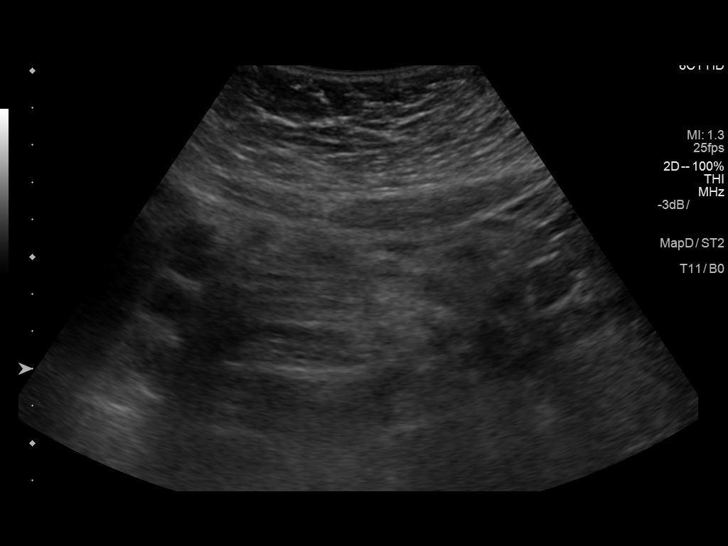

[14 of 25 positions shown; findings below may reference images not displayed]

FINDINGS: Gallbladder: No gallstones or wall thickening visualized. No
sonographic Murphy sign noted.

Common bile duct: Diameter: 5 mm, within normal limits.

Liver: No focal lesion identified. Within normal limits in
parenchymal echo genicity.

IVC: No abnormality visualized.

Pancreas: Visualized portion unremarkable.

Spleen: Size and appearance within normal limits.

Right Kidney: Length: 11.1 cm. Echogenicity within normal limits. No
mass or hydronephrosis visualized.

Left Kidney: Length: 10.8 cm. Echogenicity within normal limits. No
mass or hydronephrosis visualized.

Abdominal aorta: No aneurysm visualized.

Other findings: None.
IMPRESSION: Negative abdominal ultrasound.

## 2019-02-26 ENCOUNTER — Emergency Department (HOSPITAL_COMMUNITY)
Admission: EM | Admit: 2019-02-26 | Discharge: 2019-02-27 | Disposition: A | Discharge status: Home / Self Care | Payer: Self-pay | Attending: Emergency Medicine | Admitting: Emergency Medicine

## 2019-02-26 ENCOUNTER — Other Ambulatory Visit: Payer: Self-pay

## 2019-02-26 ENCOUNTER — Encounter (HOSPITAL_COMMUNITY): Payer: Self-pay | Admitting: *Deleted

## 2019-02-26 DIAGNOSIS — F17200 Nicotine dependence, unspecified, uncomplicated: Secondary | ICD-10-CM | POA: Insufficient documentation

## 2019-02-26 DIAGNOSIS — N3001 Acute cystitis with hematuria: Secondary | ICD-10-CM | POA: Insufficient documentation

## 2019-02-26 LAB — URINALYSIS, ROUTINE W REFLEX MICROSCOPIC
Bilirubin Urine: NEGATIVE
Glucose, UA: NEGATIVE mg/dL
Ketones, ur: 80 mg/dL — AB
Nitrite: NEGATIVE
Protein, ur: 100 mg/dL — AB
RBC / HPF: >50 RBC/hpf — ABNORMAL HIGH (ref 0–5)
Specific Gravity, Urine: 1.024 (ref 1.005–1.030)
WBC, UA: >50 WBC/hpf — ABNORMAL HIGH (ref 0–5)
pH: 8 (ref 5.0–8.0)

## 2019-02-26 LAB — COMPREHENSIVE METABOLIC PANEL
ALT: 13 U/L (ref 0–44)
AST: 17 U/L (ref 15–41)
Albumin: 4.9 g/dL (ref 3.5–5.0)
Alkaline Phosphatase: 58 U/L (ref 38–126)
Anion gap: 11 (ref 5–15)
BUN: 15 mg/dL (ref 6–20)
CO2: 21 mmol/L — ABNORMAL LOW (ref 22–32)
Calcium: 9.9 mg/dL (ref 8.9–10.3)
Chloride: 107 mmol/L (ref 98–111)
Creatinine, Ser: 0.6 mg/dL (ref 0.44–1.00)
GFR calc Af Amer: >60 mL/min (ref >60)
GFR calc non Af Amer: >60 mL/min (ref >60)
Glucose, Bld: 122 mg/dL — ABNORMAL HIGH (ref 70–99)
Potassium: 3.3 mmol/L — ABNORMAL LOW (ref 3.5–5.1)
Sodium: 139 mmol/L (ref 135–145)
Total Bilirubin: 0.4 mg/dL (ref 0.3–1.2)
Total Protein: 8.1 g/dL (ref 6.5–8.1)

## 2019-02-26 LAB — CBC
HCT: 43.4 % (ref 36.0–46.0)
Hemoglobin: 14.3 g/dL (ref 12.0–15.0)
MCH: 28.5 pg (ref 26.0–34.0)
MCHC: 32.9 g/dL (ref 30.0–36.0)
MCV: 86.6 fL (ref 80.0–100.0)
Platelets: 292 10*3/uL (ref 150–400)
RBC: 5.01 MIL/uL (ref 3.87–5.11)
RDW: 12.7 % (ref 11.5–15.5)
WBC: 12.4 10*3/uL — ABNORMAL HIGH (ref 4.0–10.5)
nRBC: 0 % (ref 0.0–0.2)

## 2019-02-26 LAB — POC URINE PREG, ED: Preg Test, Ur: NEGATIVE

## 2019-02-26 LAB — LIPASE, BLOOD: Lipase: 23 U/L (ref 11–51)

## 2019-02-26 MED ORDER — PROCHLORPERAZINE EDISYLATE 10 MG/2ML IJ SOLN
5.0000 mg | Freq: Once | INTRAMUSCULAR | Status: AC
  Administered 2019-02-26: 5 mg via INTRAVENOUS
  Filled 2019-02-26: qty 2

## 2019-02-26 MED ORDER — SODIUM CHLORIDE 0.9% FLUSH
3.0000 mL | Freq: Once | INTRAVENOUS | Status: AC
  Administered 2019-02-26: 3 mL via INTRAVENOUS

## 2019-02-26 MED ORDER — MORPHINE SULFATE (PF) 4 MG/ML IV SOLN
4.0000 mg | Freq: Once | INTRAVENOUS | Status: AC
  Administered 2019-02-26: 4 mg via INTRAVENOUS
  Filled 2019-02-26: qty 1

## 2019-02-26 MED ORDER — ONDANSETRON HCL 4 MG/2ML IJ SOLN
4.0000 mg | Freq: Once | INTRAMUSCULAR | Status: AC | PRN
  Administered 2019-02-26: 22:00:00 4 mg via INTRAVENOUS
  Filled 2019-02-26: qty 2

## 2019-02-26 NOTE — ED Triage Notes (Signed)
Pt brought in by [facility] for c/o n/v since 9am today; pt denies any fever; pt has a hx of UTI and states she has burning with urination; cbg 121

## 2019-02-26 NOTE — ED Provider Notes (Signed)
[facility] EMERGENCY DEPARTMENT Provider Note   CSN: [id number] Arrival date & time: 02/26/19  2149    History   Chief Complaint Chief Complaint  Patient presents with  . Emesis    HPI Kelsey Lambert is a 23 y.o. female.     Patient is a 23 year old female who presents to the emergency department by EMS with a complaint of nausea, vomiting, and back pain.  The patient states this problem started approximately 9 AM.  She has been having problems with nausea and vomiting most of the day.  She also states that she has some discomfort with urination, and increased urine frequency.  She has had this problem before with urinary tract infections.  She denies fever, but states that she has had some chills.  No recent injury to the back or kidney area.  No blood in the urine according to the patient.  No recent operations or procedures involving this area.  The history is provided by the patient.  Emesis  Associated symptoms: no abdominal pain, no arthralgias and no cough     History reviewed. No pertinent past medical history.  There are no active problems to display for this patient.   History reviewed. No pertinent surgical history.   OB History   No obstetric history on file.      Home Medications    Prior to Admission medications   Not on File    Family History History reviewed. No pertinent family history.  Social History Social History   Tobacco Use  . Smoking status: Former Games developermoker  . Smokeless tobacco: Never Used  Substance Use Topics  . Alcohol use: No  . Drug use: No     Allergies   Patient has no known allergies.   Review of Systems Review of Systems  Constitutional: Negative for activity change.       All ROS Neg except as noted in HPI  HENT: Negative for nosebleeds.   Eyes: Negative for photophobia and discharge.  Respiratory: Negative for cough, shortness of breath and wheezing.   Cardiovascular: Negative for chest pain and  palpitations.  Gastrointestinal: Positive for nausea and vomiting. Negative for abdominal pain and blood in stool.  Genitourinary: Positive for dysuria and frequency. Negative for hematuria.  Musculoskeletal: Negative for arthralgias, back pain and neck pain.  Skin: Negative.   Neurological: Negative for dizziness, seizures and speech difficulty.  Psychiatric/Behavioral: Negative for confusion and hallucinations.     Physical Exam Updated Vital Signs BP (!) 120/100   Pulse 78   Temp 98 F (36.7 C) (Oral)   Resp 16   Ht 4' 10.5" (1.486 m)   Wt 85.3 kg   LMP 02/11/2019   SpO2 100%   BMI 38.62 kg/m   Physical Exam Vitals signs and nursing note reviewed.  Constitutional:      Appearance: She is well-developed. She is not toxic-appearing.  HENT:     Head: Normocephalic.     Right Ear: Tympanic membrane and external ear normal.     Left Ear: Tympanic membrane and external ear normal.  Eyes:     General: Lids are normal.     Pupils: Pupils are equal, round, and reactive to light.  Neck:     Musculoskeletal: Normal range of motion and neck supple.     Vascular: No carotid bruit.  Cardiovascular:     Rate and Rhythm: Normal rate and regular rhythm.     Pulses: Normal pulses.     Heart sounds:  Normal heart sounds.  Pulmonary:     Effort: No respiratory distress.     Breath sounds: Normal breath sounds.  Abdominal:     General: Bowel sounds are normal.     Palpations: Abdomen is soft.     Tenderness: There is abdominal tenderness in the suprapubic area. There is right CVA tenderness. There is no guarding.  Musculoskeletal: Normal range of motion.  Lymphadenopathy:     Head:     Right side of head: No submandibular adenopathy.     Left side of head: No submandibular adenopathy.     Cervical: No cervical adenopathy.  Skin:    General: Skin is warm and dry.  Neurological:     Mental Status: She is alert and oriented to person, place, and time.     Cranial Nerves: No  cranial nerve deficit.     Sensory: No sensory deficit.  Psychiatric:        Speech: Speech normal.      ED Treatments / Results  Labs (all labs ordered are listed, but only abnormal results are displayed) Labs Reviewed  COMPREHENSIVE METABOLIC PANEL - Abnormal; Notable for the following components:      Result Value   Potassium 3.3 (*)    CO2 21 (*)    Glucose, Bld 122 (*)    All other components within normal limits  CBC - Abnormal; Notable for the following components:   WBC 12.4 (*)    All other components within normal limits  LIPASE, BLOOD  URINALYSIS, ROUTINE W REFLEX MICROSCOPIC  POC URINE PREG, ED    EKG None  Radiology No results found.  Procedures Procedures (including critical care time)  Medications Ordered in ED Medications  sodium chloride flush (NS) 0.9 % injection 3 mL (3 mLs Intravenous Given 02/26/19 2202)  ondansetron (ZOFRAN) injection 4 mg (4 mg Intravenous Given 02/26/19 2202)  prochlorperazine (COMPAZINE) injection 5 mg (5 mg Intravenous Given 02/26/19 2259)     Initial Impression / Assessment and Plan / ED Course  I have reviewed the triage vital signs and the nursing notes.  Pertinent labs & imaging results that were available during my care of the patient were reviewed by me and considered in my medical decision making (see chart for details).          Final Clinical Impressions(s) / ED Diagnoses MDM  Patient has had similar symptoms in the past with urinary tract infection. Vital signs reviewed.  Urine pregnancy test is negative.  Lipase is negative at 23.  The comprehensive metabolic panel is shows the potassium to be slightly low at 3.3.  The patient was treated with oral potassium.  The complete blood count shows the white blood cells to be elevated at 12,400.  There is no shift to the left.  Urine analysis shows a cloudy yellow specimen with a specific gravity of 1.024.  There is a small amount of hemoglobin on the dipstick.   There is moderate leukocyte esterase, and greater than 50 red cells, and greater than 50 white blood cells.  White blood cells were noted to be in clumps.  Culture the urine was sent to the lab.  The patient will be started on Rocephin IV.  Prescription for Keflex will be given to the patient.  Patient will also be given medication for nausea and pain.  The patient is to have the urine rechecked in about 5 to 7 days.     Final diagnoses:  Acute cystitis with hematuria  ED Discharge Orders         Ordered    cephALEXin (KEFLEX) 500 MG capsule  4 times daily     02/27/19 0015    promethazine (PHENERGAN) 12.5 MG tablet  Every 6 hours PRN     02/27/19 0015    traMADol (ULTRAM) 50 MG tablet  Every 6 hours PRN     02/27/19 0015           Ivery Quale, PA-C 02/27/19 1728    Derwood Kaplan, MD 02/28/19 [phone]

## 2019-02-27 MED ORDER — POTASSIUM CHLORIDE CRYS ER 20 MEQ PO TBCR
20.0000 meq | EXTENDED_RELEASE_TABLET | Freq: Once | ORAL | Status: AC
  Administered 2019-02-27: 01:00:00 20 meq via ORAL
  Filled 2019-02-27: qty 1

## 2019-02-27 MED ORDER — PROMETHAZINE HCL 12.5 MG PO TABS: 12.5000 mg | ORAL_TABLET | Freq: Four times a day (QID) | ORAL | 0 refills | Status: DC | PRN

## 2019-02-27 MED ORDER — CEPHALEXIN 500 MG PO CAPS: 500.0000 mg | ORAL_CAPSULE | Freq: Four times a day (QID) | ORAL | 0 refills | Status: DC

## 2019-02-27 MED ORDER — SODIUM CHLORIDE 0.9 % IV SOLN
1.0000 g | Freq: Once | INTRAVENOUS | Status: AC
  Administered 2019-02-27: 01:00:00 1 g via INTRAVENOUS
  Filled 2019-02-27: qty 10

## 2019-02-27 MED ORDER — TRAMADOL HCL 50 MG PO TABS: 50.0000 mg | ORAL_TABLET | Freq: Four times a day (QID) | ORAL | 0 refills | Status: DC | PRN

## 2019-02-27 NOTE — Discharge Instructions (Signed)
Your tests reveal a urinary tract infection.  Please increase fluids.  Please use Keflex 4 times daily with breakfast, lunch, dinner, and at bedtime until all taken.  Please have your urine rechecked

## 2019-02-27 NOTE — ED Notes (Signed)
Pt ambulatory to waiting room. Pt verbalized understanding of discharge instructions.   

## 2019-03-01 LAB — URINE CULTURE
Culture: >=100000 — AB
Special Requests: NORMAL

## 2019-03-02 ENCOUNTER — Telehealth: Payer: Self-pay | Admitting: Emergency Medicine

## 2019-03-02 NOTE — Telephone Encounter (Signed)
Post ED Visit - Positive Culture Follow-up  Culture report reviewed by antimicrobial stewardship pharmacist: [facility] []  Enzo Bi, Pharm.D. []  Celedonio Miyamoto, Pharm.D., BCPS AQ-ID []  Garvin Fila, Pharm.D., BCPS []  Georgina Pillion, [address].D., BCPS []  [place], [address].D., BCPS, AAHIVP []  Estella Husk, Pharm.D., BCPS, AAHIVP []  Lysle Pearl, PharmD, BCPS []  Phillips Climes, PharmD, BCPS []  Agapito Games, PharmD, BCPS [x]  Ewing Schlein, PharmD []  Mervyn Gay, PharmD, BCPS []  Vinnie Level, PharmD  [facility] []  Len Childs, PharmD []  Greer Pickerel, PharmD []  Adalberto Cole, PharmD []  Perlie Gold, Rph []  Lonell Face) Jean Rosenthal, PharmD []  Earl Many, PharmD []  Junita Push, PharmD []  Dorna Leitz, PharmD []  Terrilee Files, PharmD []  Lynann Beaver, PharmD []  Keturah Barre, PharmD []  Loralee Pacas, PharmD []  Bernadene Person, PharmD   Positive urine culture Treated with Cephalexin, organism sensitive to the same and no further patient follow-up is required at this time.  Carollee Herter Boyde Grieco 03/02/2019, 11:59 AM

## 2019-09-14 ENCOUNTER — Encounter (HOSPITAL_COMMUNITY): Payer: Self-pay

## 2019-09-14 ENCOUNTER — Emergency Department (HOSPITAL_COMMUNITY)
Admission: EM | Admit: 2019-09-14 | Discharge: 2019-09-14 | Disposition: A | Discharge status: Home / Self Care | Payer: Self-pay | Attending: Emergency Medicine | Admitting: Emergency Medicine

## 2019-09-14 ENCOUNTER — Emergency Department (HOSPITAL_COMMUNITY): Payer: Self-pay

## 2019-09-14 ENCOUNTER — Other Ambulatory Visit: Payer: Self-pay

## 2019-09-14 DIAGNOSIS — R102 Pelvic and perineal pain: Secondary | ICD-10-CM

## 2019-09-14 DIAGNOSIS — Z87891 Personal history of nicotine dependence: Secondary | ICD-10-CM | POA: Insufficient documentation

## 2019-09-14 DIAGNOSIS — O99891 Other specified diseases and conditions complicating pregnancy: Secondary | ICD-10-CM | POA: Insufficient documentation

## 2019-09-14 DIAGNOSIS — N3000 Acute cystitis without hematuria: Secondary | ICD-10-CM

## 2019-09-14 DIAGNOSIS — Z3A01 Less than 8 weeks gestation of pregnancy: Secondary | ICD-10-CM

## 2019-09-14 DIAGNOSIS — O468X1 Other antepartum hemorrhage, first trimester: Secondary | ICD-10-CM

## 2019-09-14 DIAGNOSIS — O23591 Infection of other part of genital tract in pregnancy, first trimester: Secondary | ICD-10-CM | POA: Insufficient documentation

## 2019-09-14 DIAGNOSIS — O26899 Other specified pregnancy related conditions, unspecified trimester: Secondary | ICD-10-CM

## 2019-09-14 DIAGNOSIS — A599 Trichomoniasis, unspecified: Secondary | ICD-10-CM

## 2019-09-14 DIAGNOSIS — O418X10 Other specified disorders of amniotic fluid and membranes, first trimester, not applicable or unspecified: Secondary | ICD-10-CM | POA: Insufficient documentation

## 2019-09-14 DIAGNOSIS — R300 Dysuria: Secondary | ICD-10-CM | POA: Insufficient documentation

## 2019-09-14 DIAGNOSIS — B9689 Other specified bacterial agents as the cause of diseases classified elsewhere: Secondary | ICD-10-CM

## 2019-09-14 DIAGNOSIS — N760 Acute vaginitis: Secondary | ICD-10-CM

## 2019-09-14 DIAGNOSIS — Z3201 Encounter for pregnancy test, result positive: Secondary | ICD-10-CM | POA: Insufficient documentation

## 2019-09-14 LAB — COMPREHENSIVE METABOLIC PANEL
ALT: 13 U/L (ref 0–44)
AST: 16 U/L (ref 15–41)
Albumin: 4.7 g/dL (ref 3.5–5.0)
Alkaline Phosphatase: 59 U/L (ref 38–126)
Anion gap: 14 (ref 5–15)
BUN: 13 mg/dL (ref 6–20)
CO2: 21 mmol/L — ABNORMAL LOW (ref 22–32)
Calcium: 9.9 mg/dL (ref 8.9–10.3)
Chloride: 103 mmol/L (ref 98–111)
Creatinine, Ser: 0.54 mg/dL (ref 0.44–1.00)
GFR calc Af Amer: >60 mL/min (ref >60)
GFR calc non Af Amer: >60 mL/min (ref >60)
Glucose, Bld: 106 mg/dL — ABNORMAL HIGH (ref 70–99)
Potassium: 3.3 mmol/L — ABNORMAL LOW (ref 3.5–5.1)
Sodium: 138 mmol/L (ref 135–145)
Total Bilirubin: 1 mg/dL (ref 0.3–1.2)
Total Protein: 8 g/dL (ref 6.5–8.1)

## 2019-09-14 LAB — WET PREP, GENITAL: Sperm: NONE SEEN

## 2019-09-14 LAB — URINALYSIS, ROUTINE W REFLEX MICROSCOPIC
Bilirubin Urine: NEGATIVE
Glucose, UA: NEGATIVE mg/dL
Ketones, ur: 80 mg/dL — AB
Nitrite: NEGATIVE
Protein, ur: 30 mg/dL — AB
RBC / HPF: >50 RBC/hpf — ABNORMAL HIGH (ref 0–5)
Specific Gravity, Urine: 1.026 (ref 1.005–1.030)
Squamous Epithelial / LPF: >50 — ABNORMAL HIGH (ref 0–5)
WBC, UA: >50 WBC/hpf — ABNORMAL HIGH (ref 0–5)
pH: 5 (ref 5.0–8.0)

## 2019-09-14 LAB — CBC WITH DIFFERENTIAL/PLATELET
Abs Immature Granulocytes: 0.03 10*3/uL (ref 0.00–0.07)
Basophils Absolute: 0 10*3/uL (ref 0.0–0.1)
Basophils Relative: 0 %
Eosinophils Absolute: 0 10*3/uL (ref 0.0–0.5)
Eosinophils Relative: 0 %
HCT: 42.7 % (ref 36.0–46.0)
Hemoglobin: 14.2 g/dL (ref 12.0–15.0)
Immature Granulocytes: 0 %
Lymphocytes Relative: 12 %
Lymphs Abs: 1.3 10*3/uL (ref 0.7–4.0)
MCH: 29.1 pg (ref 26.0–34.0)
MCHC: 33.3 g/dL (ref 30.0–36.0)
MCV: 87.5 fL (ref 80.0–100.0)
Monocytes Absolute: 0.5 10*3/uL (ref 0.1–1.0)
Monocytes Relative: 4 %
Neutro Abs: 9.7 10*3/uL — ABNORMAL HIGH (ref 1.7–7.7)
Neutrophils Relative %: 84 %
Platelets: 236 10*3/uL (ref 150–400)
RBC: 4.88 MIL/uL (ref 3.87–5.11)
RDW: 12.7 % (ref 11.5–15.5)
WBC: 11.6 10*3/uL — ABNORMAL HIGH (ref 4.0–10.5)
nRBC: 0 % (ref 0.0–0.2)

## 2019-09-14 LAB — PREGNANCY, URINE: Preg Test, Ur: POSITIVE — AB

## 2019-09-14 LAB — LIPASE, BLOOD: Lipase: 18 U/L (ref 11–51)

## 2019-09-14 LAB — HIV ANTIBODY (ROUTINE TESTING W REFLEX): HIV Screen 4th Generation wRfx: NONREACTIVE

## 2019-09-14 MED ORDER — CEFTRIAXONE SODIUM 1 G IJ SOLR
1.0000 g | Freq: Once | INTRAMUSCULAR | Status: AC
  Administered 2019-09-14: 1 g via INTRAMUSCULAR
  Filled 2019-09-14: qty 10

## 2019-09-14 MED ORDER — CEPHALEXIN 500 MG PO CAPS: 500.0000 mg | ORAL_CAPSULE | Freq: Two times a day (BID) | ORAL | 0 refills | Status: AC

## 2019-09-14 MED ORDER — SODIUM CHLORIDE 0.9 % IV BOLUS: 500.0000 mL | Freq: Once | INTRAVENOUS | Status: DC

## 2019-09-14 MED ORDER — METRONIDAZOLE 500 MG PO TABS
500.0000 mg | ORAL_TABLET | Freq: Once | ORAL | Status: AC
  Administered 2019-09-14: 500 mg via ORAL
  Filled 2019-09-14: qty 1

## 2019-09-14 MED ORDER — ONDANSETRON 4 MG PO TBDP
4.0000 mg | ORAL_TABLET | Freq: Once | ORAL | Status: AC
  Administered 2019-09-14: 4 mg via ORAL
  Filled 2019-09-14: qty 1

## 2019-09-14 MED ORDER — LIDOCAINE HCL (PF) 1 % IJ SOLN
INTRAMUSCULAR | Status: AC
  Filled 2019-09-14: qty 5

## 2019-09-14 MED ORDER — SODIUM CHLORIDE 0.9 % IV SOLN: 1.0000 g | Freq: Once | INTRAVENOUS | Status: DC

## 2019-09-14 MED ORDER — METRONIDAZOLE 500 MG PO TABS: 500.0000 mg | ORAL_TABLET | Freq: Two times a day (BID) | ORAL | 0 refills | Status: AC

## 2019-09-14 MED ORDER — AZITHROMYCIN 250 MG PO TABS
1000.0000 mg | ORAL_TABLET | Freq: Once | ORAL | Status: AC
  Administered 2019-09-14: 1000 mg via ORAL
  Filled 2019-09-14: qty 4

## 2019-09-14 NOTE — ED Notes (Signed)
Equipment at bedside for pelvic exam.

## 2019-09-14 NOTE — Discharge Instructions (Signed)
You were seen in the emergency department today with some abdominal discomfort.  We found that you are pregnant in approximately [redacted] weeks along.  We are treating you for sexually transmitted infections as well as a urinary tract infection.  You will require 2 different antibiotics for this.  Please call the OB/GYN first thing Monday to schedule a follow-up appointment.  Return to the emergency department any new or suddenly worsening symptoms.

## 2019-09-14 NOTE — ED Triage Notes (Signed)
Pt reports burning with urination and lower back pain x 1 1/2 weeks.  Reports started vomiting yesterday.  Reports LMP was Sept 24.

## 2019-09-14 NOTE — ED Provider Notes (Signed)
Emergency Department Provider Note   I have reviewed the triage vital signs and the nursing notes.   HISTORY  Chief Complaint Dysuria   HPI Kelsey Lambert is a 23 y.o. female presents to the emergency department for evaluation for dysuria and abdominal/back pain.  Patient has had several days of symptoms of progressively worsening.  She denies fever or shaking chills.  No vaginal bleeding or significant discharge.  She has been late with her menstrual cycle with her LMP being Sept 24th.  Patient states that in late October when she was late she took a home pregnancy test which was negative but has not taken one since.  She is having nausea and vomiting over the past 2 days with continued dysuria.  She does have some lower back pain which is worse on the left.    History reviewed. No pertinent past medical history.  There are no active problems to display for this patient.   History reviewed. No pertinent surgical history.  Allergies Patient has no known allergies.  No family history on file.  Social History Social History   Tobacco Use  . Smoking status: Former Research scientist (life sciences)  . Smokeless tobacco: Never Used  Substance Use Topics  . Alcohol use: No  . Drug use: No    Review of Systems  Constitutional: No fever/chills Eyes: No visual changes. ENT: No sore throat. Cardiovascular: Denies chest pain. Respiratory: Denies shortness of breath. Gastrointestinal: Positive abdominal pain. Positive nausea and vomiting.  No diarrhea.  No constipation. Genitourinary: Positive for dysuria. Musculoskeletal: Negative for back pain. Skin: Negative for rash. Neurological: Negative for headaches, focal weakness or numbness.  10-point ROS otherwise negative.  ____________________________________________   PHYSICAL EXAM:  VITAL SIGNS: ED Triage Vitals [09/14/19 0714]  Enc Vitals Group     BP 129/79     Pulse Rate 79     Resp 18     Temp 98.2 F (36.8 C)     Temp Source Oral      SpO2 100 %     Weight 185 lb (83.9 kg)     Height 4\' 10"  (1.473 m)   Constitutional: Alert and oriented. Well appearing and in no acute distress. Eyes: Conjunctivae are normal.  Head: Atraumatic. Nose: No congestion/rhinnorhea. Mouth/Throat: Mucous membranes are moist.   Neck: No stridor.   Cardiovascular: Normal rate, regular rhythm. Good peripheral circulation. Grossly normal heart sounds.   Respiratory: Normal respiratory effort.  No retractions. Lungs CTAB. Gastrointestinal: Soft with mild diffuse tenderness. No CVA tenderenss. No rebound or guarding. No distention.  GU: Exam performed with nurse chaperone and with patient's verbal consent. Moderate vaginal discharge in the vault without bleeding. No CMT. Visually closed cervix.  Musculoskeletal: No lower extremity tenderness nor edema. No gross deformities of extremities. Neurologic:  Normal speech and language. No gross focal neurologic deficits are appreciated.  Skin:  Skin is warm, dry and intact. No rash noted.   ____________________________________________   LABS (all labs ordered are listed, but only abnormal results are displayed)  Labs Reviewed  WET PREP, GENITAL - Abnormal; Notable for the following components:      Result Value   Yeast Wet Prep HPF POC PRESENT (*)    Trich, Wet Prep PRESENT (*)    Clue Cells Wet Prep HPF POC PRESENT (*)    WBC, Wet Prep HPF POC MODERATE (*)    All other components within normal limits  URINALYSIS, ROUTINE W REFLEX MICROSCOPIC - Abnormal; Notable for the following components:  Color, Urine AMBER (*)    APPearance CLOUDY (*)    Hgb urine dipstick SMALL (*)    Ketones, ur 80 (*)    Protein, ur 30 (*)    Leukocytes,Ua LARGE (*)    RBC / HPF >50 (*)    WBC, UA >50 (*)    Bacteria, UA RARE (*)    Squamous Epithelial / LPF >50 (*)    All other components within normal limits  PREGNANCY, URINE - Abnormal; Notable for the following components:   Preg Test, Ur POSITIVE (*)     All other components within normal limits  COMPREHENSIVE METABOLIC PANEL - Abnormal; Notable for the following components:   Potassium 3.3 (*)    CO2 21 (*)    Glucose, Bld 106 (*)    All other components within normal limits  CBC WITH DIFFERENTIAL/PLATELET - Abnormal; Notable for the following components:   WBC 11.6 (*)    Neutro Abs 9.7 (*)    All other components within normal limits  URINE CULTURE  LIPASE, BLOOD  HIV ANTIBODY (ROUTINE TESTING W REFLEX)  GC/CHLAMYDIA PROBE AMP ([place]) NOT AT [facility]   ____________________________________________  RADIOLOGY  [place] Ob Less Than 14 Weeks With Ob Transvaginal  Result Date: 09/14/2019 CLINICAL DATA:  Patient with lower back pain. EXAM: OBSTETRIC <14 WK US AND TRANSVAGINAL OB US TECHNIQUE: Both transabdominal and transvaginal ultrasound examinations were performed for complete evaluation of the gestation as well as the maternal uterus, adnexal regions, and pelvic cul-de-sac. Transvaginal technique was performed to assess early pregnancy. COMPARISON:  None. FINDINGS: Intrauterine gestational sac: Single Yolk sac:  Visualized. Embryo:  Visualized. Cardiac Activity: Visualized. Heart Rate: 136 bpm CRL:  3.6 mm   6 w   0 d                  US EDC: 05/09/2020 Subchorionic hemorrhage:  Moderate Maternal uterus/adnexae: Normal right ovary. Probable corpus luteum within the left ovary. Trace free fluid in the pelvis. IMPRESSION: Single live intrauterine gestation. Moderate subchorionic hemorrhage. Electronically Signed   By: Annia Beltrew  Davis M.D.   On: 09/14/2019 10:19    ____________________________________________   PROCEDURES  Procedure(s) performed:   Procedures  None ____________________________________________   INITIAL IMPRESSION / ASSESSMENT AND PLAN / ED COURSE  Pertinent labs & imaging results that were available during my care of the patient were reviewed by me and considered in my medical decision making (see chart for details).    Patient presents to the emergency department for evaluation of dysuria, nausea/vomiting, abdominal/back pain.  No focal tenderness or CVA tenderness on exam.  Patient is afebrile with normal vital signs.  No concern for sepsis.  Clinically lower suspicion for pyelonephritis.  Patient's UA shows evidence of mild to moderate infection and pregnancy test is positive.  Will obtain transvaginal ultrasound to rule out ectopic along with screening blood work and IV antibiotics while in the ED.   Labs reviewed. TVUS with IUP identified approx 6 wks. Subchorionic hemorrhage noted but patient without symptoms. Trich and BV on wet prep. Covered for Gonorrhea/Chlamydia in the ED. Will start on Flagyl x 7 days and Keflex although UA may be affected by Trich. Patient is having some UTI symptoms, however. Encouraged close OB follow up and ED return if symptoms worsen. No evidence to suspect pyelonephritis at this time. No PID or TOA concerns clinically.  ____________________________________________  FINAL CLINICAL IMPRESSION(S) / ED DIAGNOSES  Final diagnoses:  Trichimoniasis  Less than [redacted] weeks gestation  of pregnancy  Acute cystitis without hematuria  BV (bacterial vaginosis)  Subchorionic hematoma in first trimester, single or unspecified fetus     MEDICATIONS GIVEN DURING THIS VISIT:  Medications  ondansetron (ZOFRAN-ODT) disintegrating tablet 4 mg (4 mg Oral Given 09/14/19 0758)  cefTRIAXone (ROCEPHIN) injection 1 g (1 g Intramuscular Given 09/14/19 0858)  metroNIDAZOLE (FLAGYL) tablet 500 mg (500 mg Oral Given 09/14/19 1104)  azithromycin (ZITHROMAX) tablet 1,000 mg (1,000 mg Oral Given 09/14/19 1105)     NEW OUTPATIENT MEDICATIONS STARTED DURING THIS VISIT:  Discharge Medication List as of 09/14/2019 11:13 AM    START taking these medications   Details  metroNIDAZOLE (FLAGYL) 500 MG tablet Take 1 tablet (500 mg total) by mouth 2 (two) times daily for 7 days., Starting Sat 09/14/2019, Until Sat  09/21/2019, Normal        Note:  This document was prepared using Dragon voice recognition software and may include unintentional dictation errors.  Alona Bene, MD, [facility]    Serenitie Vinton, Arlyss Repress, MD 09/14/19 [phone]

## 2019-09-16 LAB — URINE CULTURE: Culture: 50000 — AB

## 2019-09-17 LAB — GC/CHLAMYDIA PROBE AMP (~~LOC~~) NOT AT ARMC
Chlamydia: NEGATIVE
Neisseria Gonorrhea: NEGATIVE

## 2019-11-08 NOTE — L&D Delivery Note (Signed)
OB/GYN Faculty Practice Delivery Note  Kelsey Lambert is a 24 y.o. G1P0 s/p vacuum-assisted vaginal delivery at [redacted]w[redacted]d. She was admitted for early labor and found to have Pre-E with severe features by blood pressure.   ROM: 5h 37m with clear fluid GBS Status: negative Maximum Maternal Temperature: 98.6*F  Labor Progress: . Admitted in early labor after being found to have pre-E with severe features by blood pressure. She was started on pitocin and magnesium. She SROMed and progressed to complete. While she was pushing, there was noted to be fetal decelerations into the 60s with slow recovery. Due to this, the decision was made to proceed with vacuum assisted vaginal delivery.  Indication for operative vaginal delivery: fetal decelerations into the 60s with slow recovery  Risks of vacuum assistance were discussed in detail, including but not limited to, bleeding, infection, damage to maternal tissues, fetal cephalohematoma, inability to effect vaginal delivery of the head or shoulder dystocia that cannot be resolved by established maneuvers and need for emergency cesarean section.  Patient gave verbal consent.  Patient was examined and found to be fully dilated with fetal station of +2. The Kiwi vacuum soft cup was positioned over the sagittal suture 3 cm anterior to posterior fontanelle.  Pressure was then increased to 500 mmHg, and the patient was instructed to push.  Pulling was administered along the pelvic curve while patient was pushing; there was 1 contractions with 3 pushes and 0 popoffs. The vacuum was removed after delivery of the head and a shoulder dystocia was noted.  The patient was immediately put into McRobert's and Wood's Screw was performed, with delivery of the anterior shoulder at 20 seconds.  Infant with spontaneous cry, placed on mother's abdomen, dried and stimulated. Cord clamped x 2 after 1-minute delay, and cut by FOB under my direct supervision. Cord blood drawn. Placenta  delivered spontaneously with gentle cord traction. Fundus firm with massage and Pitocin. Labia, perineum, vagina, and cervix were inspected, and a second degree perineal laceration was noted- repaired with Vicryl Rapide in the usual fashion.  Sponge, instrument and needle counts were correct x2.  The patient and baby were stable after delivery and remained in couplet care.  Delivery Date/Time: 04/28/20, 0044 hours Placenta: Intact, 3 vessel cord, to pathology Complications: Vacuum-assisted vaginal delivery for the indications above; 20s shoulder dystocia- resolved with McRoberts and Wood's Screw Lacerations: second degree perineal laceration- repaired with Vicryl Rapide in the usual fashion EBL: 452 mL Analgesia: epidural  Postpartum Planning [x]  message to sent to schedule follow-up  [x]  vaccines UTD  Infant: female  APGARs 7, 9  weight per medical record  Dr. was present for and supervised the vacuum-assisted vaginal delivery.  , DO OB/GYN Fellow, Faculty Practice

## 2019-12-25 ENCOUNTER — Other Ambulatory Visit: Payer: Self-pay | Admitting: Obstetrics & Gynecology

## 2019-12-25 DIAGNOSIS — Z363 Encounter for antenatal screening for malformations: Secondary | ICD-10-CM

## 2019-12-30 ENCOUNTER — Other Ambulatory Visit: Payer: Self-pay

## 2020-01-01 ENCOUNTER — Other Ambulatory Visit: Payer: Medicaid Other

## 2020-01-01 ENCOUNTER — Ambulatory Visit (INDEPENDENT_AMBULATORY_CARE_PROVIDER_SITE_OTHER): Payer: Medicaid Other

## 2020-01-01 ENCOUNTER — Other Ambulatory Visit: Payer: Self-pay

## 2020-01-01 DIAGNOSIS — Z3A21 21 weeks gestation of pregnancy: Secondary | ICD-10-CM | POA: Diagnosis not present

## 2020-01-01 DIAGNOSIS — Z3402 Encounter for supervision of normal first pregnancy, second trimester: Secondary | ICD-10-CM

## 2020-01-01 DIAGNOSIS — Z363 Encounter for antenatal screening for malformations: Secondary | ICD-10-CM | POA: Diagnosis not present

## 2020-01-01 NOTE — Progress Notes (Signed)
[place] 21+5 wks,cephalic,cx 3.1 cm,anterior placenta gr 0,normal ovaries,SVP of fluid 5.2 cm,fhr 140 bpm,EFW 493 g 74%,anatomy complete,no obvious abnormalities

## 2020-01-03 LAB — OBSTETRIC PANEL, INCLUDING HIV
Antibody Screen: NEGATIVE
Basophils Absolute: 0 10*3/uL (ref 0.0–0.2)
Basos: 0 %
EOS (ABSOLUTE): 0 10*3/uL (ref 0.0–0.4)
Eos: 0 %
HIV Screen 4th Generation wRfx: NONREACTIVE
Hematocrit: 33.3 % — ABNORMAL LOW (ref 34.0–46.6)
Hemoglobin: 11.5 g/dL (ref 11.1–15.9)
Hepatitis B Surface Ag: NEGATIVE
Immature Grans (Abs): 0.1 10*3/uL (ref 0.0–0.1)
Immature Granulocytes: 1 %
Lymphocytes Absolute: 2.2 10*3/uL (ref 0.7–3.1)
Lymphs: 15 %
MCH: 30.9 pg (ref 26.6–33.0)
MCHC: 34.5 g/dL (ref 31.5–35.7)
MCV: 90 fL (ref 79–97)
Monocytes Absolute: 0.8 10*3/uL (ref 0.1–0.9)
Monocytes: 5 %
Neutrophils Absolute: 11.5 10*3/uL — ABNORMAL HIGH (ref 1.4–7.0)
Neutrophils: 79 %
Platelets: 212 10*3/uL (ref 150–450)
RBC: 3.72 x10E6/uL — ABNORMAL LOW (ref 3.77–5.28)
RDW: 13.2 % (ref 11.7–15.4)
RPR Ser Ql: NONREACTIVE
Rh Factor: NEGATIVE
Rubella Antibodies, IGG: 0.91 index — ABNORMAL LOW (ref >0.99)
WBC: 14.7 10*3/uL — ABNORMAL HIGH (ref 3.4–10.8)

## 2020-01-03 LAB — SICKLE CELL SCREEN: Sickle Cell Screen: NEGATIVE

## 2020-01-03 LAB — HEPATITIS C ANTIBODY: Hep C Virus Ab: <0.1 s/co ratio (ref 0.0–0.9)

## 2020-01-10 ENCOUNTER — Other Ambulatory Visit (HOSPITAL_COMMUNITY)
Admission: RE | Admit: 2020-01-10 | Discharge: 2020-01-10 | Disposition: A | Discharge status: Home / Self Care | Payer: Medicaid Other | Source: Ambulatory Visit | Attending: Obstetrics & Gynecology | Admitting: Obstetrics & Gynecology

## 2020-01-10 ENCOUNTER — Other Ambulatory Visit: Payer: Self-pay

## 2020-01-10 ENCOUNTER — Encounter: Payer: Self-pay | Admitting: Women's Health

## 2020-01-10 ENCOUNTER — Ambulatory Visit: Payer: Medicaid Other | Admitting: *Deleted

## 2020-01-10 ENCOUNTER — Ambulatory Visit (INDEPENDENT_AMBULATORY_CARE_PROVIDER_SITE_OTHER): Payer: Medicaid Other | Admitting: Women's Health

## 2020-01-10 VITALS — BP 124/83 | HR 87 | Wt 182.0 lb

## 2020-01-10 DIAGNOSIS — Z3A23 23 weeks gestation of pregnancy: Secondary | ICD-10-CM | POA: Diagnosis not present

## 2020-01-10 DIAGNOSIS — O0932 Supervision of pregnancy with insufficient antenatal care, second trimester: Secondary | ICD-10-CM | POA: Diagnosis not present

## 2020-01-10 DIAGNOSIS — O09899 Supervision of other high risk pregnancies, unspecified trimester: Secondary | ICD-10-CM

## 2020-01-10 DIAGNOSIS — Z3400 Encounter for supervision of normal first pregnancy, unspecified trimester: Secondary | ICD-10-CM

## 2020-01-10 DIAGNOSIS — O99891 Other specified diseases and conditions complicating pregnancy: Secondary | ICD-10-CM | POA: Diagnosis not present

## 2020-01-10 DIAGNOSIS — Z2839 Other underimmunization status: Secondary | ICD-10-CM

## 2020-01-10 DIAGNOSIS — Z283 Underimmunization status: Secondary | ICD-10-CM | POA: Insufficient documentation

## 2020-01-10 DIAGNOSIS — Z3490 Encounter for supervision of normal pregnancy, unspecified, unspecified trimester: Secondary | ICD-10-CM | POA: Insufficient documentation

## 2020-01-10 MED ORDER — BLOOD PRESSURE MONITOR MISC: 0 refills | Status: DC

## 2020-01-10 NOTE — Progress Notes (Signed)
INITIAL OBSTETRICAL VISIT Patient name: Kelsey Lambert MRN [id number]  Date of birth: Oct 07, 1996 Chief Complaint:   Initial Prenatal Visit  History of Present Illness:   Kelsey Lambert is a 24 y.o. G1P0 Caucasian female at [redacted]w[redacted]d by LMP c/w 6wk with an Estimated Date of Delivery: 05/08/20 being seen today for her initial obstetrical visit.   Her obstetrical history is significant for primigravida.   Today she reports no complaints.  Patient's last menstrual period was 08/02/2019 (exact date). Last pap never. Results were: n/a Review of Systems:   Pertinent items are noted in HPI Denies cramping/contractions, leakage of fluid, vaginal bleeding, abnormal vaginal discharge w/ itching/odor/irritation, headaches, visual changes, shortness of breath, chest pain, abdominal pain, severe nausea/vomiting, or problems with urination or bowel movements unless otherwise stated above.  Pertinent History Reviewed:  Reviewed past medical,surgical, social, obstetrical and family history.  Reviewed problem list, medications and allergies. OB History  Gravida Para Term Preterm AB Living  1            SAB TAB Ectopic Multiple Live Births               # Outcome Date GA Lbr Len/2nd Weight Sex Delivery Anes PTL Lv  1 Current            Physical Assessment:   Vitals:   01/10/20 0948  BP: 124/83  Pulse: 87  Weight: 182 lb (82.6 kg)  Body mass index is 38.04 kg/m.       Physical Examination:  General appearance - well appearing, and in no distress  Mental status - alert, oriented to person, place, and time  Psych:  She has a normal mood and affect  Skin - warm and dry, normal color, no suspicious lesions noted  Chest - effort normal, all lung fields clear to auscultation bilaterally  Heart - normal rate and regular rhythm  Abdomen - soft, nontender  Extremities:  No swelling or varicosities noted  Pelvic - VULVA: normal appearing vulva with no masses, tenderness or lesions  VAGINA: normal  appearing vagina with normal color and discharge, no lesions  CERVIX: normal appearing cervix without discharge or lesions, no CMT  Thin prep pap is done w/ HR HPV cotesting  TODAY'S FHT: 153 via doppler FH: 24cm  No results found for this or any previous visit (from the past 24 hour(s)).  Assessment & Plan:  1) Low-Risk Pregnancy G1P0 at [redacted]w[redacted]d with an Estimated Date of Delivery: 05/08/20   2) Initial OB visit  3) Late care  Meds:  Meds ordered this encounter  Medications  . Blood Pressure Monitor MISC    Sig: For regular home bp monitoring during pregnancy    Dispense:  1 each    Refill:  0    Z34.00    Initial labs reviewed Continue prenatal vitamins Reviewed ptl s/s, fetal movement Reviewed recommended weight gain based on pre-gravid BMI Encouraged well-balanced diet Genetic Screening discussed: declined Cystic fibrosis, SMA, Fragile X screening discussed declined Ultrasound discussed; fetal survey: results reviewed CCNC completed>PCM not here, form faxed The nature of [facility] [employer] with multiple MDs and other Advanced Practice Providers was explained to patient; also emphasized that fellows, residents, and students are part of our team. Does not have home bp cuff. Rx faxed to [facility]. Check bp weekly, let us know if >140/90.   Follow-up: Return in about 4 weeks (around 02/07/2020) for LROB, PN2, in person, CNM.   Orders Placed  This Encounter  Procedures  . Urine Culture  . Pain Management Screening Profile (10S)  . POC Urinalysis Dipstick OB    Roma Schanz CNM, [facility] 01/10/2020 10:34 AM

## 2020-01-10 NOTE — Patient Instructions (Signed)
Kelsey Lambert, I greatly value your feedback.  If you receive a survey following your visit with [place] today, we appreciate you taking the time to fill it out.  Thanks, [name], CNM, WHNP-BC   You will have your sugar test next visit.  Please do not eat or drink anything after midnight the night before you come, not even water.  You will be here for at least two hours.  Please make an appointment online for the bloodwork at [email or web address] for 8:30am (or as close to this as possible). Make sure you select the [facility] service center. The day of the appointment, check in with our office first, then you will go to [facility] to start the sugar test.    [facility] HAS MOVED!!! It is now [facility] at [facility] ([address] [place], [place] [address]) Entrance C, located off of [address] valet parking  Go to [email or web address] to register for FREE online childbirth classes   Call the office [phone]) or go to [facility] if:  You begin to have strong, frequent contractions  Your water breaks.  Sometimes it is a big gush of fluid, sometimes it is just a trickle that keeps getting your panties wet or running down your legs  You have vaginal bleeding.  It is normal to have a small amount of spotting if your cervix was checked.   You don't feel your baby moving like normal.  If you don't, get you something to eat and drink and lay down and focus on feeling your baby move.   If your baby is still not moving like normal, you should call the office or go to [facility].  [place] Pediatricians/Family Doctors:  [facility] [phone]            [facility] [phone]                 [facility] [phone] (usually not accepting new patients unless you have family there already, you are always welcome to call and ask)       [facility] Department [phone]       [facility] Pediatricians/Family  Doctors:   [facility]: [phone]  [facility]: [phone]  [facility]: [phone]  [facility] Doctors:   [facility]: [phone]   [facility]: [phone]  [facility] Doctors:  [facility]: [phone]   Home Blood Pressure Monitoring for Patients   Your provider has recommended that you check your blood pressure (BP) at least once a week at home. If you do not have a blood pressure cuff at home, one will be provided for you. Contact your provider if you have not received your monitor within 1 week.   Helpful Tips for Accurate Home Blood Pressure Checks  . Don't smoke, exercise, or drink caffeine 30 minutes before checking your BP . Use the restroom before checking your BP (a full bladder can raise your pressure) . Relax in a comfortable upright chair . Feet on the ground . Left arm resting comfortably on a flat surface at the level of your heart . Legs uncrossed . Back supported . Sit quietly and don't talk . Place the cuff on your bare arm . Adjust snuggly, so that only two fingertips can fit between your skin and the top of the cuff . Check 2 readings separated by at least one minute . Keep a log of your  BP readings . For a visual, please reference this diagram: http://ccnc.care/bpdiagram  Provider Name: [facility]     Phone: [phone]  Zone 1: ALL CLEAR  Continue to monitor your symptoms:  . BP reading is less than 140 (top number) or less than 90 (bottom number)  . No right upper stomach pain . No headaches or seeing spots . No feeling nauseated or throwing up . No swelling in face and hands  Zone 2: CAUTION Call your doctor's office for any of the following:  . BP reading is greater than 140 (top number) or greater than 90 (bottom number)  . Stomach pain under your ribs in the middle or right side . Headaches or seeing spots . Feeling  nauseated or throwing up . Swelling in face and hands  Zone 3: EMERGENCY  Seek immediate medical care if you have any of the following:  . BP reading is greater than160 (top number) or greater than 110 (bottom number) . Severe headaches not improving with Tylenol . Serious difficulty catching your breath . Any worsening symptoms from Zone 2   Second Trimester of Pregnancy The second trimester is from week 13 through week 28, months 4 through 6. The second trimester is often a time when you feel your best. Your body has also adjusted to being pregnant, and you begin to feel better physically. Usually, morning sickness has lessened or quit completely, you may have more energy, and you may have an increase in appetite. The second trimester is also a time when the fetus is growing rapidly. At the end of the sixth month, the fetus is about 9 inches long and weighs about 1 pounds. You will likely begin to feel the baby move (quickening) between 18 and 20 weeks of the pregnancy. BODY CHANGES Your body goes through many changes during pregnancy. The changes vary from woman to woman.   Your weight will continue to increase. You will notice your lower abdomen bulging out.  You may begin to get stretch marks on your hips, abdomen, and breasts.  You may develop headaches that can be relieved by medicines approved by your health care provider.  You may urinate more often because the fetus is pressing on your bladder.  You may develop or continue to have heartburn as a result of your pregnancy.  You may develop constipation because certain hormones are causing the muscles that push waste through your intestines to slow down.  You may develop hemorrhoids or swollen, bulging veins (varicose veins).  You may have back pain because of the weight gain and pregnancy hormones relaxing your joints between the bones in your pelvis and as a result of a shift in weight and the muscles that support your  balance.  Your breasts will continue to grow and be tender.  Your gums may bleed and may be sensitive to brushing and flossing.  Dark spots or blotches (chloasma, mask of pregnancy) may develop on your face. This will likely fade after the baby is born.  A dark line from your belly button to the pubic area (linea nigra) may appear. This will likely fade after the baby is born.  You may have changes in your hair. These can include thickening of your hair, rapid growth, and changes in texture. Some women also have hair loss during or after pregnancy, or hair that feels dry or thin. Your hair will most likely return to normal after your baby is born. WHAT TO EXPECT AT YOUR PRENATAL VISITS  During a routine prenatal visit:  You will be weighed to make sure you and the fetus are growing normally.  Your blood pressure will be taken.  Your abdomen will be measured to track your baby's growth.  The fetal heartbeat will be listened to.  Any test results from the previous visit will be discussed. Your health care provider may ask you:  How you are feeling.  If you are feeling the baby move.  If you have had any abnormal symptoms, such as leaking fluid, bleeding, severe headaches, or abdominal cramping.  If you have any questions. Other tests that may be performed during your second trimester include:  Blood tests that check for:  Low iron levels (anemia).  Gestational diabetes (between 24 and 28 weeks).  Rh antibodies.  Urine tests to check for infections, diabetes, or protein in the urine.  An ultrasound to confirm the proper growth and development of the baby.  An amniocentesis to check for possible genetic problems.  Fetal screens for spina bifida and Down syndrome. HOME CARE INSTRUCTIONS   Avoid all smoking, herbs, alcohol, and unprescribed drugs. These chemicals affect the formation and growth of the baby.  Follow your health care provider's instructions regarding  medicine use. There are medicines that are either safe or unsafe to take during pregnancy.  Exercise only as directed by your health care provider. Experiencing uterine cramps is a good sign to stop exercising.  Continue to eat regular, healthy meals.  Wear a good support bra for breast tenderness.  Do not use hot tubs, steam rooms, or saunas.  Wear your seat belt at all times when driving.  Avoid raw meat, uncooked cheese, cat litter boxes, and soil used by cats. These carry germs that can cause birth defects in the baby.  Take your prenatal vitamins.  Try taking a stool softener (if your health care provider approves) if you develop constipation. Eat more high-fiber foods, such as fresh vegetables or fruit and whole grains. Drink plenty of fluids to keep your urine clear or pale yellow.  Take warm sitz baths to soothe any pain or discomfort caused by hemorrhoids. Use hemorrhoid cream if your health care provider approves.  If you develop varicose veins, wear support hose. Elevate your feet for 15 minutes, 3-4 times a day. Limit salt in your diet.  Avoid heavy lifting, wear low heel shoes, and practice good posture.  Rest with your legs elevated if you have leg cramps or low back pain.  Visit your dentist if you have not gone yet during your pregnancy. Use a soft toothbrush to brush your teeth and be gentle when you floss.  A sexual relationship may be continued unless your health care provider directs you otherwise.  Continue to go to all your prenatal visits as directed by your health care provider. SEEK MEDICAL CARE IF:   You have dizziness.  You have mild pelvic cramps, pelvic pressure, or nagging pain in the abdominal area.  You have persistent nausea, vomiting, or diarrhea.  You have a bad smelling vaginal discharge.  You have pain with urination. SEEK IMMEDIATE MEDICAL CARE IF:   You have a fever.  You are leaking fluid from your vagina.  You have spotting or  bleeding from your vagina.  You have severe abdominal cramping or pain.  You have rapid weight gain or loss.  You have shortness of breath with chest pain.  You notice sudden or extreme swelling of your face, hands, ankles, feet, or legs.  You have not felt your baby move in over an hour.  You have severe headaches that do not go away with medicine.  You have vision changes. Document Released: 10/18/2001 Document Revised: 10/29/2013 Document Reviewed: 12/25/2012 [facility] Patient Information 2015 [place], [place]. This information is not intended to replace advice given to you by your health care provider. Make sure you discuss any questions you have with your health care provider.  PROTECT YOURSELF & YOUR BABY FROM THE FLU! Because you are pregnant, we at [facility], along with the Centers for Disease Control (CDC), recommend that you receive the flu vaccine to protect yourself and your baby from the flu. The flu is more likely to cause severe illness in pregnant women than in women of reproductive age who are not pregnant. Changes in the immune system, heart, and lungs during pregnancy make pregnant women (and women up to two weeks postpartum) more prone to severe illness from flu, including illness resulting in hospitalization. Flu also may be harmful for a pregnant woman's developing baby. A common flu symptom is fever, which may be associated with neural tube defects and other adverse outcomes for a developing baby. Getting vaccinated can also help protect a baby after birth from flu. (Mom passes antibodies onto the developing baby during her pregnancy.)  A Flu Vaccine is the Best Protection Against Flu Getting a flu vaccine is the first and most important step in protecting against flu. Pregnant women should get a flu shot and not the live attenuated influenza vaccine (LAIV), also known as nasal spray flu vaccine. Flu vaccines given during pregnancy help protect both the mother and her  baby from flu. Vaccination has been shown to reduce the risk of flu-associated acute respiratory infection in pregnant women by up to one-half. A 2018 study showed that getting a flu shot reduced a pregnant woman's risk of being hospitalized with flu by an average of 40 percent. Pregnant women who get a flu vaccine are also helping to protect their babies from flu illness for the first several months after their birth, when they are too young to get vaccinated.   A Long Record of Safety for Flu Shots in Pregnant Women Flu shots have been given to millions of pregnant women over many years with a good safety record. There is a lot of evidence that flu vaccines can be given safely during pregnancy; though these data are limited for the first trimester. The CDC recommends that pregnant women get vaccinated during any trimester of their pregnancy. It is very important for pregnant women to get the flu shot.   Other Preventive Actions In addition to getting a flu shot, pregnant women should take the same everyday preventive actions the CDC recommends of everyone, including covering coughs, washing hands often, and avoiding people who are sick.  Symptoms and Treatment If you get sick with flu symptoms call your doctor right away. There are antiviral drugs that can treat flu illness and prevent serious flu complications. The CDC recommends prompt treatment for people who have influenza infection or suspected influenza infection and who are at high risk of serious flu complications, such as people with asthma, diabetes (including gestational diabetes), or heart disease. Early treatment of influenza in hospitalized pregnant women has been shown to reduce the length of the hospital stay.  Symptoms Flu symptoms include fever, cough, sore throat, runny or stuffy nose, body aches, headache, chills and fatigue. Some people may also have vomiting and diarrhea. People may be infected with  the flu and have respiratory  symptoms without a fever.  Early Treatment is Important for Pregnant Women Treatment should begin as soon as possible because antiviral drugs work best when started early (within 48 hours after symptoms start). Antiviral drugs can make your flu illness milder and make you feel better faster. They may also prevent serious health problems that can result from flu illness. Oral oseltamivir (Tamiflu) is the preferred treatment for pregnant women because it has the most studies available to suggest that it is safe and beneficial. Antiviral drugs require a prescription from your provider. Having a fever caused by flu infection or other infections early in pregnancy may be linked to birth defects in a baby. In addition to taking antiviral drugs, pregnant women who get a fever should treat their fever with Tylenol (acetaminophen) and contact their provider immediately.  When to [place] If you are pregnant and have any of these signs, seek care immediately:  Difficulty breathing or shortness of breath  Pain or pressure in the chest or abdomen  Sudden dizziness  Confusion  Severe or persistent vomiting  High fever that is not responding to Tylenol (or store brand equivalent)  Decreased or no movement of your baby  [email or web address]

## 2020-01-11 LAB — MED LIST OPTION NOT SELECTED

## 2020-01-12 LAB — URINE CULTURE

## 2020-01-13 ENCOUNTER — Encounter: Payer: Self-pay | Admitting: Women's Health

## 2020-01-13 DIAGNOSIS — F1290 Cannabis use, unspecified, uncomplicated: Secondary | ICD-10-CM | POA: Insufficient documentation

## 2020-01-13 LAB — CYTOLOGY - PAP
Chlamydia: NEGATIVE
Comment: NEGATIVE
Comment: NORMAL
Diagnosis: NEGATIVE
Neisseria Gonorrhea: NEGATIVE

## 2020-01-13 LAB — PMP SCREEN PROFILE (10S), URINE
Amphetamine Scrn, Ur: NEGATIVE ng/mL
BARBITURATE SCREEN URINE: NEGATIVE ng/mL
BENZODIAZEPINE SCREEN, URINE: NEGATIVE ng/mL
CANNABINOIDS UR QL SCN: POSITIVE ng/mL — AB
Cocaine (Metab) Scrn, Ur: NEGATIVE ng/mL
Creatinine(Crt), U: 20.3 mg/dL (ref 20.0–300.0)
Methadone Screen, Urine: NEGATIVE ng/mL
OXYCODONE+OXYMORPHONE UR QL SCN: NEGATIVE ng/mL
Opiate Scrn, Ur: NEGATIVE ng/mL
Ph of Urine: 8.3 (ref 4.5–8.9)
Phencyclidine Qn, Ur: NEGATIVE ng/mL
Propoxyphene Scrn, Ur: NEGATIVE ng/mL

## 2020-02-10 ENCOUNTER — Ambulatory Visit (INDEPENDENT_AMBULATORY_CARE_PROVIDER_SITE_OTHER): Payer: Medicaid Other | Admitting: Advanced Practice Midwife

## 2020-02-10 ENCOUNTER — Encounter: Payer: Self-pay | Admitting: Advanced Practice Midwife

## 2020-02-10 ENCOUNTER — Other Ambulatory Visit: Payer: Self-pay

## 2020-02-10 ENCOUNTER — Other Ambulatory Visit: Payer: Medicaid Other

## 2020-02-10 VITALS — BP 121/81 | HR 89 | Wt 186.0 lb

## 2020-02-10 DIAGNOSIS — Z331 Pregnant state, incidental: Secondary | ICD-10-CM

## 2020-02-10 DIAGNOSIS — Z131 Encounter for screening for diabetes mellitus: Secondary | ICD-10-CM

## 2020-02-10 DIAGNOSIS — Z3493 Encounter for supervision of normal pregnancy, unspecified, third trimester: Secondary | ICD-10-CM

## 2020-02-10 DIAGNOSIS — Z3400 Encounter for supervision of normal first pregnancy, unspecified trimester: Secondary | ICD-10-CM

## 2020-02-10 DIAGNOSIS — Z1389 Encounter for screening for other disorder: Secondary | ICD-10-CM

## 2020-02-10 DIAGNOSIS — Z3A27 27 weeks gestation of pregnancy: Secondary | ICD-10-CM

## 2020-02-10 LAB — POCT URINALYSIS DIPSTICK OB
Blood, UA: NEGATIVE
Glucose, UA: NEGATIVE
Leukocytes, UA: NEGATIVE
Nitrite, UA: NEGATIVE

## 2020-02-10 NOTE — Patient Instructions (Signed)
Kelsey Lambert, I greatly value your feedback.  If you receive a survey following your visit with [place] today, we appreciate you taking the time to fill it out.  Thanks, Cathie Beams, DNP, CNM  [facility] HAS MOVED!!! It is now [facility] at [facility] ([address] [place], [place] [address]) Entrance located off of [address] Free 24/7 valet parking   Go to [email or web address] to register for FREE online childbirth classes    Call the office [phone]) or go to [facility] if:  You begin to have strong, frequent contractions  Your water breaks.  Sometimes it is a big gush of fluid, sometimes it is just a trickle that keeps getting your panties wet or running down your legs  You have vaginal bleeding.  It is normal to have a small amount of spotting if your cervix was checked.   You don't feel your baby moving like normal.  If you don't, get you something to eat and drink and lay down and focus on feeling your baby move.  You should feel at least 10 movements in 2 hours.  If you don't, you should call the office or go to [facility].   Home Blood Pressure Monitoring for Patients   Your provider has recommended that you check your blood pressure (BP) at least once a week at home. If you do not have a blood pressure cuff at home, one will be provided for you. Contact your provider if you have not received your monitor within 1 week.   Helpful Tips for Accurate Home Blood Pressure Checks  . Don't smoke, exercise, or drink caffeine 30 minutes before checking your BP . Use the restroom before checking your BP (a full bladder can raise your pressure) . Relax in a comfortable upright chair . Feet on the ground . Left arm resting comfortably on a flat surface at the level of your heart . Legs uncrossed . Back supported . Sit quietly and don't talk . Place the cuff on your bare arm . Adjust snuggly, so that only two fingertips can fit  between your skin and the top of the cuff . Check 2 readings separated by at least one minute . Keep a log of your BP readings . For a visual, please reference this diagram: http://ccnc.care/bpdiagram  Provider Name: [facility]     Phone: [phone]  Zone 1: ALL CLEAR  Continue to monitor your symptoms:  . BP reading is less than 140 (top number) or less than 90 (bottom number)  . No right upper stomach pain . No headaches or seeing spots . No feeling nauseated or throwing up . No swelling in face and hands  Zone 2: CAUTION Call your doctor's office for any of the following:  . BP reading is greater than 140 (top number) or greater than 90 (bottom number)  . Stomach pain under your ribs in the middle or right side . Headaches or seeing spots . Feeling nauseated or throwing up . Swelling in face and hands  Zone 3: EMERGENCY  Seek immediate medical care if you have any of the following:  . BP reading is greater than160 (top number) or greater than 110 (bottom number) . Severe headaches not improving with Tylenol . Serious difficulty catching your breath . Any worsening symptoms from Zone 2

## 2020-02-10 NOTE — Progress Notes (Signed)
   LOW-RISK PREGNANCY VISIT Patient name: Kelsey Lambert MRN [id number]  Date of birth: [birth year] Chief Complaint:   Routine Prenatal Visit  History of Present Illness:   Kelsey Lambert is a 24 y.o. G1P0 female at [redacted]w[redacted]d with an Estimated Date of Delivery: 05/08/20 being seen today for ongoing management of a low-risk pregnancy.  Today she reports no complaints. Contractions: Not present. Vag. Bleeding: None.  Movement: Present. denies leaking of fluid. Review of Systems:   Pertinent items are noted in HPI Denies abnormal vaginal discharge w/ itching/odor/irritation, headaches, visual changes, shortness of breath, chest pain, abdominal pain, severe nausea/vomiting, or problems with urination or bowel movements unless otherwise stated above. Pertinent History Reviewed:  Reviewed past medical,surgical, social, obstetrical and family history.  Reviewed problem list, medications and allergies. Physical Assessment:   Vitals:   02/10/20 0926  BP: 121/81  Pulse: 89  Weight: 186 lb (84.4 kg)  Body mass index is 38.87 kg/m.        Physical Examination:   General appearance: Well appearing, and in no distress  Mental status: Alert, oriented to person, place, and time  Skin: Warm & dry  Cardiovascular: Normal heart rate noted  Respiratory: Normal respiratory effort, no distress  Abdomen: Soft, gravid, nontender  Pelvic: Cervical exam deferred         Extremities: Edema: None  Fetal Status: Fetal Heart Rate (bpm): 148 Fundal Height: 26 cm Movement: Present    Chaperone: n/a    Results for orders placed or performed in visit on 02/10/20 (from the past 24 hour(s))  POC Urinalysis Dipstick OB   Collection Time: 02/10/20  9:27 AM  Result Value Ref Range   Color, UA     Clarity, UA     Glucose, UA Negative Negative   Bilirubin, UA     Ketones, UA moderate    Spec Grav, UA     Blood, UA neg    pH, UA     POC,PROTEIN,UA Trace Negative, Trace, Small (1+), Moderate (2+), Large (3+), 4+    Urobilinogen, UA     Nitrite, UA neg    Leukocytes, UA Negative Negative   Appearance     Odor      Assessment & Plan:  1) Low-risk pregnancy G1P0 at [redacted]w[redacted]d with an Estimated Date of Delivery: 05/08/20     Meds: No orders of the defined types were placed in this encounter.  Labs/procedures today: PN2  Plan:  Continue routine obstetrical care  Next visit: prefers online    Reviewed: Preterm labor symptoms and general obstetric precautions including but not limited to vaginal bleeding, contractions, leaking of fluid and fetal movement were reviewed in detail with the patient.  All questions were answered. Has home bp cuff.. Check bp weekly, let us know if >140/90.   Follow-up: Return in about 3 weeks (around 03/02/2020) for [facility] Mychart visit.  Orders Placed This Encounter  Procedures  . POC Urinalysis Dipstick OB   Jacklyn Shell DNP, CNM 02/10/2020 9:38 AM

## 2020-02-11 LAB — CBC
Hematocrit: 34.7 % (ref 34.0–46.6)
Hemoglobin: 11.7 g/dL (ref 11.1–15.9)
MCH: 30.1 pg (ref 26.6–33.0)
MCHC: 33.7 g/dL (ref 31.5–35.7)
MCV: 89 fL (ref 79–97)
Platelets: 208 10*3/uL (ref 150–450)
RBC: 3.89 x10E6/uL (ref 3.77–5.28)
RDW: 12.6 % (ref 11.7–15.4)
WBC: 13.1 10*3/uL — ABNORMAL HIGH (ref 3.4–10.8)

## 2020-02-11 LAB — RPR: RPR Ser Ql: NONREACTIVE

## 2020-02-11 LAB — GLUCOSE TOLERANCE, 2 HOURS W/ 1HR
Glucose, 1 hour: 144 mg/dL (ref 65–179)
Glucose, 2 hour: 86 mg/dL (ref 65–152)
Glucose, Fasting: 85 mg/dL (ref 65–91)

## 2020-02-11 LAB — HIV ANTIBODY (ROUTINE TESTING W REFLEX): HIV Screen 4th Generation wRfx: NONREACTIVE

## 2020-02-11 LAB — ANTIBODY SCREEN: Antibody Screen: NEGATIVE

## 2020-03-02 ENCOUNTER — Telehealth (INDEPENDENT_AMBULATORY_CARE_PROVIDER_SITE_OTHER): Payer: Medicaid Other | Admitting: Women's Health

## 2020-03-02 ENCOUNTER — Encounter: Payer: Self-pay | Admitting: Women's Health

## 2020-03-02 ENCOUNTER — Other Ambulatory Visit: Payer: Self-pay

## 2020-03-02 VITALS — BP 135/88 | HR 116

## 2020-03-02 DIAGNOSIS — Z3403 Encounter for supervision of normal first pregnancy, third trimester: Secondary | ICD-10-CM

## 2020-03-02 DIAGNOSIS — Z3400 Encounter for supervision of normal first pregnancy, unspecified trimester: Secondary | ICD-10-CM

## 2020-03-02 NOTE — Patient Instructions (Signed)
Kelsey Lambert, I greatly value your feedback.  If you receive a survey following your visit with [place] today, we appreciate you taking the time to fill it out.  Thanks, Kelsey Lambert, CNM, WHNP-BC   [facility] ([address] [place], [place] [address]) Entrance C, located off of [address] valet parking  Go to [email or web address] to register for FREE online childbirth classes   Call the office [phone]) or go to [facility] if:  You begin to have strong, frequent contractions  Your water breaks.  Sometimes it is a big gush of fluid, sometimes it is just a trickle that keeps getting your panties wet or running down your legs  You have vaginal bleeding.  It is normal to have a small amount of spotting if your cervix was checked.   You don't feel your baby moving like normal.  If you don't, get you something to eat and drink and lay down and focus on feeling your baby move.  You should feel at least 10 movements in 2 hours.  If you don't, you should call the office or go to [facility]    Call the office [phone]) or go to [facility] hospital for these signs of pre-eclampsia:  Severe headache that does not go away with Tylenol  Visual changes- seeing spots, double, blurred vision  Pain under your right breast or upper abdomen that does not go away with Tums or heartburn medicine  Nausea and/or vomiting  Severe swelling in your hands, feet, and face   Tdap Vaccine  It is recommended that you get the Tdap vaccine during the third trimester of EACH pregnancy to help protect your baby from getting pertussis (whooping cough)  27-36 weeks is the BEST time to do this so that you can pass the protection on to your baby. During pregnancy is better than after pregnancy, but if you are unable to get it during pregnancy it will be offered at the hospital.   You can get this vaccine with [place], at the health department, your family doctor, or some  local pharmacies  Everyone who will be around your baby should also be up-to-date on their vaccines before the baby comes. Adults (who are not pregnant) only need 1 dose of Tdap during adulthood.   For your lower back pain you may:  Purchase a pregnancy/maternity support belt from [employer], [employer], [employer], [employer], etc and wear it while you are up and about  Take warm baths  Use a heating pad to your lower back for no longer than 20 minutes at a time, and do not place near abdomen  Take tylenol as needed. Please follow directions on the bottle  Kinesiology tape (can get from sporting goods store), google how to tape belly for pregnancy    [place] Pediatricians/Family Doctors:  [employer] [phone]            [facility] [phone]                 [facility] [phone] (usually not accepting new patients unless you have family there already, you are always welcome to call and ask)       [facility] [phone]       [facility] Pediatricians/Family Doctors:   [facility]: [phone]  [facility]: [phone]  [facility]: [phone]  [facility] Doctors:   [facility]: [phone]   [facility]: [phone]  [facility] Doctors:  [facility]: [phone]   Home Blood Pressure Monitoring for Patients   Your provider has recommended that you check your blood pressure (BP) at least once a week at home. If you do not have a blood pressure cuff at home, one will be provided for you. Contact your provider if you have not received your monitor within 1 week.   Helpful Tips for Accurate Home Blood Pressure Checks  . Don't smoke, exercise, or drink caffeine 30 minutes before checking your BP . Use the restroom before checking your BP (a full bladder can raise your pressure) . Relax in a  comfortable upright chair . Feet on the ground . Left arm resting comfortably on a flat surface at the level of your heart . Legs uncrossed . Back supported . Sit quietly and don't talk . Place the cuff on your bare arm . Adjust snuggly, so that only two fingertips can fit between your skin and the top of the cuff . Check 2 readings separated by at least one minute . Keep a log of your BP readings . For a visual, please reference this diagram: http://ccnc.care/bpdiagram  Provider Name: [facility]     Phone: [phone]  Zone 1: ALL CLEAR  Continue to monitor your symptoms:  . BP reading is less than 140 (top number) or less than 90 (bottom number)  . No right upper stomach pain . No headaches or seeing spots . No feeling nauseated or throwing up . No swelling in face and hands  Zone 2: CAUTION Call your doctor's office for any of the following:  . BP reading is greater than 140 (top number) or greater than 90 (bottom number)  . Stomach pain under your ribs in the middle or right side . Headaches or seeing spots . Feeling nauseated or throwing up . Swelling in face and hands  Zone 3: EMERGENCY  Seek immediate medical care if you have any of the following:  . BP reading is greater than160 (top number) or greater than 110 (bottom number) . Severe headaches not improving with Tylenol . Serious difficulty catching your breath . Any worsening symptoms from Zone 2   Third Trimester of Pregnancy The third trimester is from week 29 through week 42, months 7 through 9. The third trimester is a time when the fetus is growing rapidly. At the end of the ninth month, the fetus is about 20 inches in length and weighs 6-10 pounds.  BODY CHANGES Your body goes through many changes during pregnancy. The changes vary from woman to woman.   Your weight will continue to increase. You can expect to gain 25-35 pounds (11-16 kg) by the end of the pregnancy.  You may begin to get stretch  marks on your hips, abdomen, and breasts.  You may urinate more often because the fetus is moving lower into your pelvis and pressing on your bladder.  You may develop or continue to have heartburn as a result of your pregnancy.  You may develop constipation because certain hormones are causing the muscles that push waste through your intestines to slow down.  You may develop hemorrhoids or swollen, bulging veins (varicose veins).  You may have pelvic pain because of the weight gain and pregnancy hormones relaxing your joints between the bones in your pelvis. Backaches may result from overexertion of the muscles supporting your posture.  You may have changes in your hair. These can include thickening of your  hair, rapid growth, and changes in texture. Some women also have hair loss during or after pregnancy, or hair that feels dry or thin. Your hair will most likely return to normal after your baby is born.  Your breasts will continue to grow and be tender. A yellow discharge may leak from your breasts called colostrum.  Your belly button may stick out.  You may feel short of breath because of your expanding uterus.  You may notice the fetus "dropping," or moving lower in your abdomen.  You may have a bloody mucus discharge. This usually occurs a few days to a week before labor begins.  Your cervix becomes thin and soft (effaced) near your due date. WHAT TO EXPECT AT YOUR PRENATAL EXAMS  You will have prenatal exams every 2 weeks until week 36. Then, you will have weekly prenatal exams. During a routine prenatal visit:  You will be weighed to make sure you and the fetus are growing normally.  Your blood pressure is taken.  Your abdomen will be measured to track your baby's growth.  The fetal heartbeat will be listened to.  Any test results from the previous visit will be discussed.  You may have a cervical check near your due date to see if you have effaced. At around 36 weeks,  your caregiver will check your cervix. At the same time, your caregiver will also perform a test on the secretions of the vaginal tissue. This test is to determine if a type of bacteria, Group B streptococcus, is present. Your caregiver will explain this further. Your caregiver may ask you:  What your birth plan is.  How you are feeling.  If you are feeling the baby move.  If you have had any abnormal symptoms, such as leaking fluid, bleeding, severe headaches, or abdominal cramping.  If you have any questions. Other tests or screenings that may be performed during your third trimester include:  Blood tests that check for low iron levels (anemia).  Fetal testing to check the health, activity level, and growth of the fetus. Testing is done if you have certain medical conditions or if there are problems during the pregnancy. FALSE LABOR You may feel small, irregular contractions that eventually go away. These are called Braxton Hicks contractions, or false labor. Contractions may last for hours, days, or even weeks before true labor sets in. If contractions come at regular intervals, intensify, or become painful, it is best to be seen by your caregiver.  SIGNS OF LABOR   Menstrual-like cramps.  Contractions that are 5 minutes apart or less.  Contractions that start on the top of the uterus and spread down to the lower abdomen and back.  A sense of increased pelvic pressure or back pain.  A watery or bloody mucus discharge that comes from the vagina. If you have any of these signs before the 37th week of pregnancy, call your caregiver right away. You need to go to the hospital to get checked immediately. HOME CARE INSTRUCTIONS   Avoid all smoking, herbs, alcohol, and unprescribed drugs. These chemicals affect the formation and growth of the baby.  Follow your caregiver's instructions regarding medicine use. There are medicines that are either safe or unsafe to take during  pregnancy.  Exercise only as directed by your caregiver. Experiencing uterine cramps is a good sign to stop exercising.  Continue to eat regular, healthy meals.  Wear a good support bra for breast tenderness.  Do not use hot tubs, steam rooms,  or saunas.  Wear your seat belt at all times when driving.  Avoid raw meat, uncooked cheese, cat litter boxes, and soil used by cats. These carry germs that can cause birth defects in the baby.  Take your prenatal vitamins.  Try taking a stool softener (if your caregiver approves) if you develop constipation. Eat more high-fiber foods, such as fresh vegetables or fruit and whole grains. Drink plenty of fluids to keep your urine clear or pale yellow.  Take warm sitz baths to soothe any pain or discomfort caused by hemorrhoids. Use hemorrhoid cream if your caregiver approves.  If you develop varicose veins, wear support hose. Elevate your feet for 15 minutes, 3-4 times a day. Limit salt in your diet.  Avoid heavy lifting, wear low heal shoes, and practice good posture.  Rest a lot with your legs elevated if you have leg cramps or low back pain.  Visit your dentist if you have not gone during your pregnancy. Use a soft toothbrush to brush your teeth and be gentle when you floss.  A sexual relationship may be continued unless your caregiver directs you otherwise.  Do not travel far distances unless it is absolutely necessary and only with the approval of your caregiver.  Take prenatal classes to understand, practice, and ask questions about the labor and delivery.  Make a trial run to the hospital.  Pack your hospital bag.  Prepare the baby's nursery.  Continue to go to all your prenatal visits as directed by your caregiver. SEEK MEDICAL CARE IF:  You are unsure if you are in labor or if your water has broken.  You have dizziness.  You have mild pelvic cramps, pelvic pressure, or nagging pain in your abdominal area.  You have  persistent nausea, vomiting, or diarrhea.  You have a bad smelling vaginal discharge.  You have pain with urination. SEEK IMMEDIATE MEDICAL CARE IF:   You have a fever.  You are leaking fluid from your vagina.  You have spotting or bleeding from your vagina.  You have severe abdominal cramping or pain.  You have rapid weight loss or gain.  You have shortness of breath with chest pain.  You notice sudden or extreme swelling of your face, hands, ankles, feet, or legs.  You have not felt your baby move in over an hour.  You have severe headaches that do not go away with medicine.  You have vision changes. Document Released: 10/18/2001 Document Revised: 10/29/2013 Document Reviewed: 12/25/2012 [facility] Patient Information 2015 [place], [place]. This information is not intended to replace advice given to you by your health care provider. Make sure you discuss any questions you have with your health care provider.

## 2020-03-02 NOTE — Progress Notes (Addendum)
TELEHEALTH VIRTUAL OBSTETRICS VISIT ENCOUNTER NOTE Patient name: Kelsey Lambert MRN [id number]  Date of birth: Dec 17, 1995  I connected with patient on 03/02/20 at  8:30 AM EDT by MyChart video and verified that I am speaking with the correct person using two identifiers. Due to COVID-19 recommendations, pt is not currently in our office, she is at her home.    I discussed the limitations, risks, security and privacy concerns of performing an evaluation and management service by telephone and the availability of in person appointments. I also discussed with the patient that there may be a patient responsible charge related to this service. The patient expressed understanding and agreed to proceed.  Chief Complaint:   Routine Prenatal Visit  History of Present Illness:   Kelsey Lambert is a 24 y.o. G1P0 female at [redacted]w[redacted]d with an Estimated Date of Delivery: 05/08/20 being evaluated today for ongoing management of a low-risk pregnancy.  Depression screen [facility] 2/9 02/10/2020 01/10/2020 01/10/2020  Decreased Interest 1 2 3   Down, Depressed, Hopeless 0 0 0  PHQ - 2 Score 1 2 3   Altered sleeping 2 2 1   Tired, decreased energy 2 1 0  Change in appetite 2 2 1   Feeling bad or failure about yourself  0 0 0  Trouble concentrating 2 0 0  Moving slowly or fidgety/restless 0 0 0  Suicidal thoughts 0 0 0  PHQ-9 Score 9 7 5   Difficult doing work/chores - - Not difficult at all    Today she reports no complaints. Initial bp elevated, high-normal on recheck, states she had just finished throwing up- which is normal for her- declines nausea meds. Denies ha, visual changes, ruq/epigastric pain, n/v. Last checked bp about a week ago b/c she felt faint and it was normal.  Low back pain.  Contractions: Not present. Vag. Bleeding: None.  Movement: Present. denies leaking of fluid. Review of Systems:   Pertinent items are noted in HPI Denies abnormal vaginal discharge w/ itching/odor/irritation, headaches, visual  changes, shortness of breath, chest pain, abdominal pain, severe nausea/vomiting, or problems with urination or bowel movements unless otherwise stated above. Pertinent History Reviewed:  Reviewed past medical,surgical, social, obstetrical and family history.  Reviewed problem list, medications and allergies. Physical Assessment:   Vitals:   03/02/20 0834 03/02/20 0839  BP: (!) 132/91 135/88  Pulse: 95 (!) 116  There is no height or weight on file to calculate BMI.        Physical Examination:   General:  Alert, oriented and cooperative.   Mental Status: Normal mood and affect perceived. Normal judgment and thought content.  Rest of physical exam deferred due to type of encounter  No results found for this or any previous visit (from the past 24 hour(s)).  Assessment & Plan:  1) Pregnancy G1P0 at [redacted]w[redacted]d with an Estimated Date of Delivery: 05/08/20   2) Initial bp elevated, high-normal on repeat, asymptomatic, to start checking bp QID at home, keep log, come in this week for bp check w/ nurse and bring log. Reviewed pre-e s/s, reasons to seek care.   3) Nausea> declines meds  4) Low back pain> gave printed prevention/relief measures   5) Rh neg> needs rhogam, to come this week   Meds: No orders of the defined types were placed in this encounter.   Labs/procedures today: none  Plan:  Continue routine obstetrical care.  Has home bp cuff.   Next visit: prefers online    Reviewed: Preterm labor symptoms  and general obstetric precautions including but not limited to vaginal bleeding, contractions, leaking of fluid and fetal movement were reviewed in detail with the patient. The patient was advised to call back or seek an in-person office evaluation/go to [facility]U at [facility] for any urgent or concerning symptoms. All questions were answered. Please refer to After Visit Summary for other counseling recommendations.    I provided 15 minutes of non-face-to-face time during  this encounter.  Follow-up: Return for this week for tdap and rhogam, bp check with nurse, then 2wks for LROB online w/ CNM.  No orders of the defined types were placed in this encounter.  Cheral Marker CNM, [facility] 03/02/2020 9:09 AM

## 2020-03-03 ENCOUNTER — Ambulatory Visit (INDEPENDENT_AMBULATORY_CARE_PROVIDER_SITE_OTHER): Payer: Medicaid Other | Admitting: *Deleted

## 2020-03-03 ENCOUNTER — Other Ambulatory Visit: Payer: Self-pay

## 2020-03-03 VITALS — BP 112/69 | HR 83

## 2020-03-03 DIAGNOSIS — O26893 Other specified pregnancy related conditions, third trimester: Secondary | ICD-10-CM | POA: Diagnosis not present

## 2020-03-03 DIAGNOSIS — Z6791 Unspecified blood type, Rh negative: Secondary | ICD-10-CM | POA: Diagnosis not present

## 2020-03-03 DIAGNOSIS — Z3400 Encounter for supervision of normal first pregnancy, unspecified trimester: Secondary | ICD-10-CM

## 2020-03-03 DIAGNOSIS — Z23 Encounter for immunization: Secondary | ICD-10-CM | POA: Diagnosis not present

## 2020-03-03 DIAGNOSIS — O0932 Supervision of pregnancy with insufficient antenatal care, second trimester: Secondary | ICD-10-CM

## 2020-03-03 NOTE — Progress Notes (Signed)
   NURSE VISIT- INJECTION  SUBJECTIVE:  Kelsey Lambert is a 24 y.o. G1P0 female here for a Rhophylac and Tdap for protection from tetanus, diptheria, and pertussis and Rh neg status during pregnancy. She is [redacted]w[redacted]d pregnant.   OBJECTIVE:  BP 112/69   Pulse 83   LMP 08/02/2019 (Exact Date)   Appears well, in no apparent distress  Injection administered in: Tdap Left deltoid, Rhogam left VG  No orders of the defined types were placed in this encounter.   ASSESSMENT: Pregnancy [redacted]w[redacted]d Rhophylac and Tdap for protection from tetanus, diptheria, and pertussis and Rh neg status during pregnancy PLAN: Follow-up: as scheduled   Jobe Marker  03/03/2020 12:24 PM

## 2020-03-16 ENCOUNTER — Other Ambulatory Visit: Payer: Self-pay

## 2020-03-16 ENCOUNTER — Encounter: Payer: Self-pay | Admitting: Women's Health

## 2020-03-16 ENCOUNTER — Telehealth (INDEPENDENT_AMBULATORY_CARE_PROVIDER_SITE_OTHER): Payer: Medicaid Other | Admitting: Women's Health

## 2020-03-16 VITALS — BP 122/83 | HR 84

## 2020-03-16 DIAGNOSIS — Z3A32 32 weeks gestation of pregnancy: Secondary | ICD-10-CM

## 2020-03-16 DIAGNOSIS — Z3403 Encounter for supervision of normal first pregnancy, third trimester: Secondary | ICD-10-CM

## 2020-03-16 DIAGNOSIS — Z3400 Encounter for supervision of normal first pregnancy, unspecified trimester: Secondary | ICD-10-CM

## 2020-03-16 NOTE — Progress Notes (Signed)
TELEHEALTH VIRTUAL OBSTETRICS VISIT ENCOUNTER NOTE Patient name: Kelsey Lambert MRN [id number]  Date of birth: [birth year]  I connected with patient on 03/16/20 at  8:50 AM EDT by MyChart video  and verified that I am speaking with the correct person using two identifiers. Due to COVID-19 recommendations, pt is not currently in our office.    I discussed the limitations, risks, security and privacy concerns of performing an evaluation and management service by telephone and the availability of in person appointments. I also discussed with the patient that there may be a patient responsible charge related to this service. The patient expressed understanding and agreed to proceed.  Chief Complaint:   Routine Prenatal Visit  History of Present Illness:   Kelsey Lambert is a 24 y.o. G1P0 female at [redacted]w[redacted]d with an Estimated Date of Delivery: 05/08/20 being evaluated today for ongoing management of a low-risk pregnancy.  Depression screen [facility] 2/9 02/10/2020 01/10/2020 01/10/2020  Decreased Interest 1 2 3   Down, Depressed, Hopeless 0 0 0  PHQ - 2 Score 1 2 3   Altered sleeping 2 2 1   Tired, decreased energy 2 1 0  Change in appetite 2 2 1   Feeling bad or failure about yourself  0 0 0  Trouble concentrating 2 0 0  Moving slowly or fidgety/restless 0 0 0  Suicidal thoughts 0 0 0  PHQ-9 Score 9 7 5   Difficult doing work/chores - - Not difficult at all    Today she reports no complaints. Contractions: Not present. Vag. Bleeding: None.  Movement: Present. denies leaking of fluid. Review of Systems:   Pertinent items are noted in HPI Denies abnormal vaginal discharge w/ itching/odor/irritation, headaches, visual changes, shortness of breath, chest pain, abdominal pain, severe nausea/vomiting, or problems with urination or bowel movements unless otherwise stated above. Pertinent History Reviewed:  Reviewed past medical,surgical, social, obstetrical and family history.  Reviewed problem list, medications  and allergies. Physical Assessment:   Vitals:   03/16/20 0851  BP: 122/83  Pulse: 84  There is no height or weight on file to calculate BMI.        Physical Examination:   General:  Alert, oriented and cooperative.   Mental Status: Normal mood and affect perceived. Normal judgment and thought content.  Rest of physical exam deferred due to type of encounter  No results found for this or any previous visit (from the past 24 hour(s)).  Assessment & Plan:  1) Pregnancy G1P0 at [redacted]w[redacted]d with an Estimated Date of Delivery: 05/08/20    Meds: No orders of the defined types were placed in this encounter.  Labs/procedures today: none  Plan:  Continue routine obstetrical care.  Has home bp cuff.  Check bp weekly, let know if >140/90.  Next visit: prefers online    Reviewed: Preterm labor symptoms and general obstetric precautions including but not limited to vaginal bleeding, contractions, leaking of fluid and fetal movement were reviewed in detail with the patient. The patient was advised to call back or seek an in-person office evaluation/go to [facility] at [facility] for any urgent or concerning symptoms. All questions were answered. Please refer to After Visit Summary for other counseling recommendations.    I provided 15 minutes of non-face-to-face time during this encounter.  Follow-up: Return in about 2 weeks (around 03/30/2020) for LROB, CNM, MyChart Video.  No orders of the defined types were placed in this encounter.  [redacted]w[redacted]d CNM, [facility] 03/16/2020 9:21 AM

## 2020-03-16 NOTE — Patient Instructions (Signed)
Kelsey Lambert, I greatly value your feedback.  If you receive a survey following your visit with [place] today, we appreciate you taking the time to fill it out.  Thanks, Kelsey Lambert, CNM, WHNP-BC  [facility] ([address] [place], [place] [address]) Entrance C, located off of [address] valet parking   Go to [email or web address] to register for FREE online childbirth classes    Call the office [phone]) or go to [facility] if:  You begin to have strong, frequent contractions  Your water breaks.  Sometimes it is a big gush of fluid, sometimes it is just a trickle that keeps getting your panties wet or running down your legs  You have vaginal bleeding.  It is normal to have a small amount of spotting if your cervix was checked.   You don't feel your baby moving like normal.  If you don't, get you something to eat and drink and lay down and focus on feeling your baby move.  You should feel at least 10 movements in 2 hours.  If you don't, you should call the office or go to [facility].   Call the office [phone]) or go to [facility] hospital for these signs of pre-eclampsia:  Severe headache that does not go away with Tylenol  Visual changes- seeing spots, double, blurred vision  Pain under your right breast or upper abdomen that does not go away with Tums or heartburn medicine  Nausea and/or vomiting  Severe swelling in your hands, feet, and face    Home Blood Pressure Monitoring for Patients   Your provider has recommended that you check your blood pressure (BP) at least once a week at home. If you do not have a blood pressure cuff at home, one will be provided for you. Contact your provider if you have not received your monitor within 1 week.   Helpful Tips for Accurate Home Blood Pressure Checks  . Don't smoke, exercise, or drink caffeine 30 minutes before checking your BP . Use the restroom before checking your BP (a full  bladder can raise your pressure) . Relax in a comfortable upright chair . Feet on the ground . Left arm resting comfortably on a flat surface at the level of your heart . Legs uncrossed . Back supported . Sit quietly and don't talk . Place the cuff on your bare arm . Adjust snuggly, so that only two fingertips can fit between your skin and the top of the cuff . Check 2 readings separated by at least one minute . Keep a log of your BP readings . For a visual, please reference this diagram: http://ccnc.care/bpdiagram  Provider Name: [facility]     Phone: [phone]  Zone 1: ALL CLEAR  Continue to monitor your symptoms:  . BP reading is less than 140 (top number) or less than 90 (bottom number)  . No right upper stomach pain . No headaches or seeing spots . No feeling nauseated or throwing up . No swelling in face and hands  Zone 2: CAUTION Call your doctor's office for any of the following:  . BP reading is greater than 140 (top number) or greater than 90 (bottom number)  . Stomach pain under your ribs in the middle or right side . Headaches or seeing spots . Feeling nauseated or throwing up . Swelling in face and hands  Zone 3: EMERGENCY  Seek immediate medical care if you have any of  the following:  . BP reading is greater than160 (top number) or greater than 110 (bottom number) . Severe headaches not improving with Tylenol . Serious difficulty catching your breath . Any worsening symptoms from Zone 2  Preterm Labor and Birth Information  The normal length of a pregnancy is 39-41 weeks. Preterm labor is when labor starts before 37 completed weeks of pregnancy. What are the risk factors for preterm labor? Preterm labor is more likely to occur in women who:  Have certain infections during pregnancy such as a bladder infection, sexually transmitted infection, or infection inside the uterus (chorioamnionitis).  Have a shorter-than-normal cervix.  Have gone into  preterm labor before.  Have had surgery on their cervix.  Are younger than age 54 or older than age 25.  Are African American.  Are pregnant with twins or multiple babies (multiple gestation).  Take street drugs or smoke while pregnant.  Do not gain enough weight while pregnant.  Became pregnant shortly after having been pregnant. What are the symptoms of preterm labor? Symptoms of preterm labor include:  Cramps similar to those that can happen during a menstrual period. The cramps may happen with diarrhea.  Pain in the abdomen or lower back.  Regular uterine contractions that may feel like tightening of the abdomen.  A feeling of increased pressure in the pelvis.  Increased watery or bloody mucus discharge from the vagina.  Water breaking (ruptured amniotic sac). Why is it important to recognize signs of preterm labor? It is important to recognize signs of preterm labor because babies who are born prematurely may not be fully developed. This can put them at an increased risk for:  Long-term (chronic) heart and lung problems.  Difficulty immediately after birth with regulating body systems, including blood sugar, body temperature, heart rate, and breathing rate.  Bleeding in the brain.  Cerebral palsy.  Learning difficulties.  Death. These risks are highest for babies who are born before 48 weeks of pregnancy. How is preterm labor treated? Treatment depends on the length of your pregnancy, your condition, and the health of your baby. It may involve: 1. Having a stitch (suture) placed in your cervix to prevent your cervix from opening too early (cerclage). 2. Taking or being given medicines, such as: ? Hormone medicines. These may be given early in pregnancy to help support the pregnancy. ? Medicine to stop contractions. ? Medicines to help mature the baby's lungs. These may be prescribed if the risk of delivery is high. ? Medicines to prevent your baby from  developing cerebral palsy. If the labor happens before 34 weeks of pregnancy, you may need to stay in the hospital. What should I do if I think I am in preterm labor? If you think that you are going into preterm labor, call your health care provider right away. How can I prevent preterm labor in future pregnancies? To increase your chance of having a full-term pregnancy:  Do not use any tobacco products, such as cigarettes, chewing tobacco, and e-cigarettes. If you need help quitting, ask your health care provider.  Do not use street drugs or medicines that have not been prescribed to you during your pregnancy.  Talk with your health care provider before taking any herbal supplements, even if you have been taking them regularly.  Make sure you gain a healthy amount of weight during your pregnancy.  Watch for infection. If you think that you might have an infection, get it checked right away.  Make sure to  tell your health care provider if you have gone into preterm labor before. This information is not intended to replace advice given to you by your health care provider. Make sure you discuss any questions you have with your health care provider. Document Revised: 02/15/2019 Document Reviewed: 03/16/2016 Elsevier Patient Education  [place].

## 2020-04-27 ENCOUNTER — Inpatient Hospital Stay (HOSPITAL_COMMUNITY): Payer: Medicaid Other | Admitting: Anesthesiology

## 2020-04-27 ENCOUNTER — Inpatient Hospital Stay (HOSPITAL_COMMUNITY)
Admission: AD | Admit: 2020-04-27 | Discharge: 2020-04-30 | DRG: 807 | Disposition: A | Discharge status: Home / Self Care | Payer: Medicaid Other | Attending: Obstetrics & Gynecology | Admitting: Obstetrics & Gynecology

## 2020-04-27 ENCOUNTER — Other Ambulatory Visit: Payer: Self-pay

## 2020-04-27 ENCOUNTER — Encounter (HOSPITAL_COMMUNITY): Payer: Self-pay | Admitting: Obstetrics and Gynecology

## 2020-04-27 DIAGNOSIS — O0932 Supervision of pregnancy with insufficient antenatal care, second trimester: Secondary | ICD-10-CM

## 2020-04-27 DIAGNOSIS — Z3A38 38 weeks gestation of pregnancy: Secondary | ICD-10-CM | POA: Diagnosis not present

## 2020-04-27 DIAGNOSIS — O1410 Severe pre-eclampsia, unspecified trimester: Secondary | ICD-10-CM | POA: Diagnosis present

## 2020-04-27 DIAGNOSIS — O139 Gestational [pregnancy-induced] hypertension without significant proteinuria, unspecified trimester: Secondary | ICD-10-CM

## 2020-04-27 DIAGNOSIS — Z6791 Unspecified blood type, Rh negative: Secondary | ICD-10-CM

## 2020-04-27 DIAGNOSIS — Z20822 Contact with and (suspected) exposure to covid-19: Secondary | ICD-10-CM | POA: Diagnosis present

## 2020-04-27 DIAGNOSIS — O26893 Other specified pregnancy related conditions, third trimester: Secondary | ICD-10-CM | POA: Diagnosis present

## 2020-04-27 DIAGNOSIS — F1290 Cannabis use, unspecified, uncomplicated: Secondary | ICD-10-CM | POA: Diagnosis present

## 2020-04-27 DIAGNOSIS — O1414 Severe pre-eclampsia complicating childbirth: Principal | ICD-10-CM | POA: Diagnosis present

## 2020-04-27 DIAGNOSIS — Z3400 Encounter for supervision of normal first pregnancy, unspecified trimester: Secondary | ICD-10-CM

## 2020-04-27 DIAGNOSIS — Z87891 Personal history of nicotine dependence: Secondary | ICD-10-CM | POA: Diagnosis not present

## 2020-04-27 DIAGNOSIS — O99324 Drug use complicating childbirth: Secondary | ICD-10-CM | POA: Diagnosis present

## 2020-04-27 DIAGNOSIS — O09899 Supervision of other high risk pregnancies, unspecified trimester: Secondary | ICD-10-CM

## 2020-04-27 DIAGNOSIS — Z2839 Other underimmunization status: Secondary | ICD-10-CM

## 2020-04-27 HISTORY — DX: Unspecified asthma, uncomplicated: J45.909

## 2020-04-27 LAB — CBC
HCT: 35.9 % — ABNORMAL LOW (ref 36.0–46.0)
Hemoglobin: 11.7 g/dL — ABNORMAL LOW (ref 12.0–15.0)
MCH: 27.5 pg (ref 26.0–34.0)
MCHC: 32.6 g/dL (ref 30.0–36.0)
MCV: 84.5 fL (ref 80.0–100.0)
Platelets: 209 10*3/uL (ref 150–400)
RBC: 4.25 MIL/uL (ref 3.87–5.11)
RDW: 14.5 % (ref 11.5–15.5)
WBC: 15.2 10*3/uL — ABNORMAL HIGH (ref 4.0–10.5)
nRBC: 0 % (ref 0.0–0.2)

## 2020-04-27 LAB — MAGNESIUM: Magnesium: 4.2 mg/dL — ABNORMAL HIGH (ref 1.7–2.4)

## 2020-04-27 LAB — COMPREHENSIVE METABOLIC PANEL
ALT: 10 U/L (ref 0–44)
AST: 16 U/L (ref 15–41)
Albumin: 2.7 g/dL — ABNORMAL LOW (ref 3.5–5.0)
Alkaline Phosphatase: 285 U/L — ABNORMAL HIGH (ref 38–126)
Anion gap: 11 (ref 5–15)
BUN: <5 mg/dL — ABNORMAL LOW (ref 6–20)
CO2: 19 mmol/L — ABNORMAL LOW (ref 22–32)
Calcium: 8.9 mg/dL (ref 8.9–10.3)
Chloride: 108 mmol/L (ref 98–111)
Creatinine, Ser: 0.64 mg/dL (ref 0.44–1.00)
GFR calc Af Amer: >60 mL/min (ref >60)
GFR calc non Af Amer: >60 mL/min (ref >60)
Glucose, Bld: 95 mg/dL (ref 70–99)
Potassium: 4 mmol/L (ref 3.5–5.1)
Sodium: 138 mmol/L (ref 135–145)
Total Bilirubin: 0.3 mg/dL (ref 0.3–1.2)
Total Protein: 6.2 g/dL — ABNORMAL LOW (ref 6.5–8.1)

## 2020-04-27 LAB — PROTEIN / CREATININE RATIO, URINE
Creatinine, Urine: 75.75 mg/dL
Protein Creatinine Ratio: 0.34 mg/mg{Cre} — ABNORMAL HIGH (ref 0.00–0.15)
Total Protein, Urine: 26 mg/dL

## 2020-04-27 LAB — TYPE AND SCREEN
ABO/RH(D): A NEG
Antibody Screen: POSITIVE

## 2020-04-27 LAB — URINALYSIS, ROUTINE W REFLEX MICROSCOPIC
Bilirubin Urine: NEGATIVE
Glucose, UA: NEGATIVE mg/dL
Hgb urine dipstick: NEGATIVE
Ketones, ur: NEGATIVE mg/dL
Leukocytes,Ua: NEGATIVE
Nitrite: NEGATIVE
Protein, ur: NEGATIVE mg/dL
Specific Gravity, Urine: 1.009 (ref 1.005–1.030)
pH: 6 (ref 5.0–8.0)

## 2020-04-27 LAB — RAPID URINE DRUG SCREEN, HOSP PERFORMED
Amphetamines: NOT DETECTED
Barbiturates: NOT DETECTED
Benzodiazepines: NOT DETECTED
Cocaine: NOT DETECTED
Opiates: NOT DETECTED
Tetrahydrocannabinol: POSITIVE — AB

## 2020-04-27 LAB — SARS CORONAVIRUS 2 BY RT PCR (HOSPITAL ORDER, PERFORMED IN ~~LOC~~ HOSPITAL LAB): SARS Coronavirus 2: NEGATIVE

## 2020-04-27 LAB — RPR: RPR Ser Ql: NONREACTIVE

## 2020-04-27 LAB — GROUP B STREP BY PCR: Group B strep by PCR: NEGATIVE

## 2020-04-27 MED ORDER — LABETALOL HCL 5 MG/ML IV SOLN: 20.0000 mg | INTRAVENOUS | Status: DC | PRN

## 2020-04-27 MED ORDER — FENTANYL-BUPIVACAINE-NACL 0.5-0.125-0.9 MG/250ML-% EP SOLN
12.0000 mL/h | EPIDURAL | Status: DC | PRN
  Filled 2020-04-27: qty 250

## 2020-04-27 MED ORDER — DIPHENHYDRAMINE HCL 50 MG/ML IJ SOLN: 12.5000 mg | INTRAMUSCULAR | Status: DC | PRN

## 2020-04-27 MED ORDER — LACTATED RINGERS IV SOLN: INTRAVENOUS | Status: DC

## 2020-04-27 MED ORDER — LACTATED RINGERS IV SOLN
500.0000 mL | Freq: Once | INTRAVENOUS | Status: AC
  Administered 2020-04-27: 500 mL via INTRAVENOUS

## 2020-04-27 MED ORDER — LABETALOL HCL 5 MG/ML IV SOLN
20.0000 mg | INTRAVENOUS | Status: DC | PRN
  Administered 2020-04-27: 20 mg via INTRAVENOUS
  Filled 2020-04-27: qty 4

## 2020-04-27 MED ORDER — LABETALOL HCL 5 MG/ML IV SOLN: 80.0000 mg | INTRAVENOUS | Status: DC | PRN

## 2020-04-27 MED ORDER — EPHEDRINE 5 MG/ML INJ: 10.0000 mg | INTRAVENOUS | Status: DC | PRN

## 2020-04-27 MED ORDER — LACTATED RINGERS IV SOLN: 500.0000 mL | INTRAVENOUS | Status: DC | PRN

## 2020-04-27 MED ORDER — HYDRALAZINE HCL 20 MG/ML IJ SOLN: 10.0000 mg | INTRAMUSCULAR | Status: DC | PRN

## 2020-04-27 MED ORDER — TERBUTALINE SULFATE 1 MG/ML IJ SOLN: 0.2500 mg | Freq: Once | INTRAMUSCULAR | Status: DC | PRN

## 2020-04-27 MED ORDER — LIDOCAINE HCL (PF) 1 % IJ SOLN: 30.0000 mL | INTRAMUSCULAR | Status: DC | PRN

## 2020-04-27 MED ORDER — OXYCODONE-ACETAMINOPHEN 5-325 MG PO TABS: 1.0000 | ORAL_TABLET | ORAL | Status: DC | PRN

## 2020-04-27 MED ORDER — ACETAMINOPHEN 325 MG PO TABS
650.0000 mg | ORAL_TABLET | ORAL | Status: DC | PRN
  Administered 2020-04-27 – 2020-04-28 (×2): 650 mg via ORAL
  Filled 2020-04-27 (×2): qty 2

## 2020-04-27 MED ORDER — PHENYLEPHRINE 40 MCG/ML (10ML) SYRINGE FOR IV PUSH (FOR BLOOD PRESSURE SUPPORT): 80.0000 ug | PREFILLED_SYRINGE | INTRAVENOUS | Status: DC | PRN

## 2020-04-27 MED ORDER — SOD CITRATE-CITRIC ACID 500-334 MG/5ML PO SOLN: 30.0000 mL | ORAL | Status: DC | PRN

## 2020-04-27 MED ORDER — LABETALOL HCL 5 MG/ML IV SOLN
40.0000 mg | INTRAVENOUS | Status: DC | PRN
  Filled 2020-04-27: qty 8

## 2020-04-27 MED ORDER — PHENYLEPHRINE 40 MCG/ML (10ML) SYRINGE FOR IV PUSH (FOR BLOOD PRESSURE SUPPORT)
80.0000 ug | PREFILLED_SYRINGE | INTRAVENOUS | Status: DC | PRN
  Filled 2020-04-27: qty 10

## 2020-04-27 MED ORDER — SODIUM CHLORIDE (PF) 0.9 % IJ SOLN
INTRAMUSCULAR | Status: DC | PRN
  Administered 2020-04-27: 12 mL/h via EPIDURAL

## 2020-04-27 MED ORDER — OXYTOCIN BOLUS FROM INFUSION
333.0000 mL | Freq: Once | INTRAVENOUS | Status: AC
  Administered 2020-04-28: 333 mL via INTRAVENOUS

## 2020-04-27 MED ORDER — OXYTOCIN-SODIUM CHLORIDE 30-0.9 UT/500ML-% IV SOLN
2.5000 [IU]/h | INTRAVENOUS | Status: DC
  Administered 2020-04-28 (×2): 2.5 [IU]/h via INTRAVENOUS
  Filled 2020-04-27: qty 500

## 2020-04-27 MED ORDER — OXYTOCIN-SODIUM CHLORIDE 30-0.9 UT/500ML-% IV SOLN
1.0000 m[IU]/min | INTRAVENOUS | Status: DC
  Administered 2020-04-27: 2 m[IU]/min via INTRAVENOUS

## 2020-04-27 MED ORDER — LIDOCAINE HCL (PF) 1 % IJ SOLN
INTRAMUSCULAR | Status: DC | PRN
  Administered 2020-04-27: 7 mL via EPIDURAL
  Administered 2020-04-27: 3 mL via EPIDURAL

## 2020-04-27 MED ORDER — MAGNESIUM SULFATE BOLUS VIA INFUSION
4.0000 g | Freq: Once | INTRAVENOUS | Status: AC
  Administered 2020-04-27: 4 g via INTRAVENOUS
  Filled 2020-04-27: qty 1000

## 2020-04-27 MED ORDER — PROMETHAZINE HCL 25 MG PO TABS: 25.0000 mg | ORAL_TABLET | Freq: Once | ORAL | Status: DC

## 2020-04-27 MED ORDER — PROMETHAZINE HCL 25 MG/ML IJ SOLN
25.0000 mg | Freq: Once | INTRAMUSCULAR | Status: AC
  Administered 2020-04-27: 25 mg via INTRAVENOUS
  Filled 2020-04-27: qty 1

## 2020-04-27 MED ORDER — PANTOPRAZOLE SODIUM 40 MG IV SOLR
40.0000 mg | Freq: Once | INTRAVENOUS | Status: AC
  Administered 2020-04-27: 40 mg via INTRAVENOUS
  Filled 2020-04-27: qty 40

## 2020-04-27 MED ORDER — ONDANSETRON HCL 4 MG/2ML IJ SOLN
4.0000 mg | Freq: Four times a day (QID) | INTRAMUSCULAR | Status: DC | PRN
  Administered 2020-04-27 – 2020-04-28 (×3): 4 mg via INTRAVENOUS
  Filled 2020-04-27 (×3): qty 2

## 2020-04-27 MED ORDER — LABETALOL HCL 5 MG/ML IV SOLN: 40.0000 mg | INTRAVENOUS | Status: DC | PRN

## 2020-04-27 MED ORDER — MAGNESIUM SULFATE 40 GM/1000ML IV SOLN
2.0000 g/h | INTRAVENOUS | Status: DC
  Filled 2020-04-27: qty 1000

## 2020-04-27 MED ORDER — OXYCODONE-ACETAMINOPHEN 5-325 MG PO TABS: 2.0000 | ORAL_TABLET | ORAL | Status: DC | PRN

## 2020-04-27 NOTE — MAU Note (Signed)
Pt presents to MAU with c/o ctx that started yesterday in the early morning. She states ctx are 3-5 min apart lasting 1 min in duration. She denies VB and LOF. +FM

## 2020-04-27 NOTE — Progress Notes (Signed)
Labor Progress Note Kelsey Lambert is a 23 y.o. G1P0 at [redacted]w[redacted]d presented for SOL with preeclampsia (increased BP & pr/cr)  S:  Pt resting in bed, denies pain or discomfort. Denies HA, RUQ or epigastric pain, no visual disturbances or dizziness. FOB at bedside for support.   O:  BP (!) 144/106   Pulse 77   Temp 98.6 F (37 C) (Oral)   Resp 17   Ht 4' 10.5" (1.486 m)   Wt 84.4 kg   LMP 08/02/2019 (Exact Date)   SpO2 98%   BMI 38.21 kg/m  EFM: baseline 140 bpm/ moderate variability/ + accels/ no decels  Toco: q1.5-4.28min SVE: Dilation: 4.5 Effacement (%): 90 Cervical Position: Posterior Station: -2 Presentation: Vertex Exam by:: Ashley Mariner RNC Pitocin: low dose to start  A/P: 24 y.o. G1P0 [redacted]w[redacted]d  1. Labor: early 2. FWB: cat 1 3. Pain: controlled by epidural 4. No cervical change since epidural placed (4hrs+), to begin low dose Pitocin to be titrated until adequate contraction pattern with max of 73mu/min.  5. Consulted with Dr. Debroah Loop regarding her last BP: 144/106, BP appears to be steadily creeping up. To begin magnesium therapy.  Patient aware and verbalized understanding with no additional questions/concerns.   Anticipate SVD.  Bernerd Limbo, CNM 4:47 PM

## 2020-04-27 NOTE — Progress Notes (Signed)
Kelsey Lambert is a 24 y.o. G1P0 at [redacted]w[redacted]d admitted for early labor, with augmentation 2/2 Pre-E w/ SF (Blood pressure)  Subjective: Feeling awful, has been vomiting all evening. S/p Phenergan and Protonix which seem to help. Positioned in high fowlers.  Objective: BP 100/65    Pulse 78    Temp 98.4 F (36.9 C) (Oral)    Resp 16    Ht 4' 10.5" (1.486 m)    Wt 84.4 kg    LMP 08/02/2019 (Exact Date)    SpO2 100%    BMI 38.21 kg/m  Total I/O In: 493.7 [I.V.:493.7] Out: 575 [Urine:375; Emesis/NG output:200]  FHT:  FHR: 130-135 bpm, variability: moderate,  accelerations:  Present,  decelerations:  Present occasional variable UC:   regular, every 2-4 minutes  SVE:   Dilation: Lip/rim Effacement (%): 100 Station: 0 Exam by:: MScott, RN  Pitocin @ 8 mu/min  Labs: Lab Results  Component Value Date   WBC 15.2 (H) 04/27/2020   HGB 11.7 (L) 04/27/2020   HCT 35.9 (L) 04/27/2020   MCV 84.5 04/27/2020   PLT 209 04/27/2020    Assessment / Plan: 24 y.o. G1P0 at [redacted]w[redacted]d admitted for early labor, with augmentation 2/2 Pre-E w/ SF (Blood pressure)  Labor: S/p SROM, on pitocin since 1600- now at 8. Anticipate vaginal delivery Fetal Wellbeing:  Category I Pain Control:  Epidural Pre-eclampsia: On mag, no recent severe range blood pressures. Mag level 4.2. Continue to monitor I/D:  GBS negative Anticipated MOD:  vaginal  Nausea and vomiting: Noit really helped with Zofran, seems to have improved with phenergan and protonix  Margarette Asal Kenyetta Wimbish DO OB Fellow, Faculty Practice 04/27/2020, 11:30 PM

## 2020-04-27 NOTE — Anesthesia Preprocedure Evaluation (Signed)
Anesthesia Evaluation  Patient identified by MRN, date of birth, ID band Patient awake    Reviewed: Allergy & Precautions, H&P , NPO status , Patient's Chart, lab work & pertinent test results  History of Anesthesia Complications Negative for: history of anesthetic complications  Airway Mallampati: II  TM Distance: >3 FB Neck ROM: full    Dental no notable dental hx.    Pulmonary asthma (childhood) , former smoker,    Pulmonary exam normal        Cardiovascular hypertension, Normal cardiovascular exam Rhythm:regular Rate:Normal     Neuro/Psych negative neurological ROS  negative psych ROS   GI/Hepatic negative GI ROS, Neg liver ROS,   Endo/Other  negative endocrine ROS  Renal/GU negative Renal ROS  negative genitourinary   Musculoskeletal   Abdominal   Peds  Hematology negative hematology ROS (+)   Anesthesia Other Findings Pre-eclampsia w/ severe range pressures  Reproductive/Obstetrics (+) Pregnancy                             Anesthesia Physical Anesthesia Plan  ASA: III  Anesthesia Plan: Epidural   Post-op Pain Management:    Induction:   PONV Risk Score and Plan:   Airway Management Planned:   Additional Equipment:   Intra-op Plan:   Post-operative Plan:   Informed Consent: I have reviewed the patients History and Physical, chart, labs and discussed the procedure including the risks, benefits and alternatives for the proposed anesthesia with the patient or authorized representative who has indicated his/her understanding and acceptance.       Plan Discussed with:   Anesthesia Plan Comments:         Anesthesia Quick Evaluation

## 2020-04-27 NOTE — MAU Provider Note (Signed)
   S: Ms. Kelsey Lambert is a 24 y.o. G1P0 at [redacted]w[redacted]d  who presents to MAU today complaining of contractions q 2-5 minutes since yesterday morning (04/26/20). She denies vaginal bleeding. She denies LOF. She reports normal fetal movement.    Patient endorses elevated blood pressure on one other occasion. She denies headache, visual disturbances, RUQ/epigastric pain, new onset swelling or weight gain.   She received intermittent care with [facility].  O: BP (!) 190/97   Pulse 84   Temp 97.8 F (36.6 C) (Oral)   Resp 18   LMP 08/02/2019 (Exact Date)   SpO2 100%  GENERAL: Well-developed, well-nourished female in no acute distress.  HEAD: Normocephalic, atraumatic.  CHEST: Normal effort of breathing, regular heart rate ABDOMEN: Soft, nontender, gravid  Cervical exam:  Dilation: 3.5 Effacement (%): 60 Cervical Position: Posterior Station: -2 Presentation: Vertex Exam by:: B. Bowen, RN   Fetal Monitoring: Baseline: 145 Variability: Mod Accelerations: POS 15 x 15 Decelerations: None Contractions: Irregular q 2-5 min   A: SIUP at [redacted]w[redacted]d  Elevated BP 03/02/2020 at 30w 3d Elevated BP today including severe range x 2 Labetalol Protocol ordered after first severe range Labetalol 20 mg administered after second severe range per policy PEC and admission labs collected Vertex presentation confirmed with BSUS  P: Admit to L&D Report called to M. Denyse Amass, CNM  Girl/breast/POPs/desires epidural  S. Reita Cliche, [place] 04/27/2020 8:55 AM

## 2020-04-27 NOTE — Progress Notes (Signed)
Patient ID: TYRIANA HELMKAMP, female   DOB: 04-Feb-1996, 24 y.o.   MRN: [id number] Labor Progress Note TELESIA ATES is a 24 y.o. G1P0 at [redacted]w[redacted]d presented for SOL with severe range BP, elevated pr/cr.  S:  Pt resting in bed with FOB at bedside for support, epidural in place.  O:  BP (!) 138/94   Pulse 91   Temp 98.3 F (36.8 C) (Oral)   Resp 16   Ht 4' 10.5" (1.486 m)   Wt 84.4 kg   LMP 08/02/2019 (Exact Date)   SpO2 98%   BMI 38.21 kg/m  EFM: baseline 135 bpm/ moderate variability/ + accels/ no decels  Toco: q3-23min SVE: Dilation: 5 Effacement (%): 90 Cervical Position: Posterior Station: -2 Presentation: Vertex Exam by:: Ashley Mariner RNC   A/P: 24 y.o. G1P0 [redacted]w[redacted]d  1. Labor: early to active  2. FWB: cat 1 3. Pain: controlled with epidural 4. Consulted with Dr. Debroah Loop re pre-eclampsia, will start mag if she has additional severe range bp. 5. Pitocin to start if needed.  Anticipate SVD.  Bernerd Limbo, CNM 1:26 PM

## 2020-04-27 NOTE — Progress Notes (Deleted)
OBSTETRIC ADMISSION HISTORY AND PHYSICAL  Kelsey Lambert is a 24 y.o. female G1P0 with IUP at [redacted]w[redacted]d presenting for early labor with severe range pressures in MAU. She reports +FMs. No LOF, VB, blurry vision, headaches, peripheral edema, or RUQ pain. She plans on breastfeeding. She requests POPs for birth control.  Dating: By LMP --->  Estimated Date of Delivery: 05/08/20  Sono: normal  @[redacted]w[redacted]d , normal anatomy, cephalic presentation, 74%ile, EFW 493g   Prenatal History/Complications: LPNC 23wk Declined genetic testing +THC  Past Medical History: Past Medical History:  Diagnosis Date  . Asthma   . Medical history non-contributory     Past Surgical History: Past Surgical History:  Procedure Laterality Date  . WISDOM TOOTH EXTRACTION      Obstetrical History: OB History    Gravida  1   Para      Term      Preterm      AB      Living        SAB      TAB      Ectopic      Multiple      Live Births              Social History: Social History   Socioeconomic History  . Marital status: Single    Spouse name: Not on file  . Number of children: Not on file  . Years of education: Not on file  . Highest education level: Not on file  Occupational History  . Not on file  Tobacco Use  . Smoking status: Former  . Smokeless tobacco: Never Used  Vaping Use  . Vaping Use: Never used  Substance and Sexual Activity  . Alcohol use: No  . Drug use: Yes    Types: Marijuana    Comment: last week  . Sexual activity: Not Currently    Birth control/protection: None  Other Topics Concern  . Not on file  Social History Narrative  . Not on file   Social Determinants of Health   Financial Resource Strain: Low Risk   . Difficulty of Paying Living Expenses: Not hard at all  Food Insecurity: No Food Insecurity  . Worried About Games developer in the Last Year: Never true  . Ran Out of Food in the Last Year: Never true  Transportation Needs: No  Transportation Needs  . Lack of Transportation (Medical): No  . Lack of Transportation (Non-Medical): No  Physical Activity: Inactive  . Days of Exercise per Week: 0 days  . Minutes of Exercise per Session: 0 min  Stress: No Stress Concern Present  . Feeling of Stress : Only a little  Social Connections: Unknown  . Frequency of Communication with Friends and Family: Once a week  . Frequency of Social Gatherings with Friends and Family: Not on file  . Attends Religious Services: Never  . Active Member of Clubs or Organizations: No  . Attends Programme researcher, broadcasting/film/video Meetings: Never  . Marital Status: Never married    Family History: Family History  Problem Relation Age of Onset  . Diabetes Mother   . COPD Mother   . Cancer Maternal Grandmother        lung  . Cancer Maternal Uncle     Allergies: No Known Allergies  Medications Prior to Admission  Medication Sig Dispense Refill Last Dose  . Blood Pressure Monitor MISC For regular home bp monitoring during pregnancy 1 each 0 Past Week at Unknown time  Review of Systems:  All systems reviewed and negative except as stated in HPI  PE: Blood pressure (!) 138/94, pulse 91, temperature 98.3 F (36.8 C), temperature source Oral, resp. rate 16, height 4' 10.5" (1.486 m), weight 84.4 kg, last menstrual period 08/02/2019, SpO2 98 %. General appearance: alert, cooperative and no distress Lungs: regular rate and effort Heart: regular rate  Abdomen: soft, non-tender Extremities: Homans sign is negative, no sign of DVT Presentation: cephalic EFM: 542 bpm, moderate variability, no accels, no decels Toco: q4-105min Dilation: 5 Effacement (%): 90 Station: -2 Exam by:: August Albino RNC  Prenatal labs: ABO, Rh: --/--/A NEG (06/21 0827) Antibody: POS (06/21 0827) Rubella: 0.91 (02/24 1208) RPR: NON REACTIVE (06/21 0820)  HBsAg: Negative (02/24 1208)  HIV: Non Reactive (04/05 0855)  GBS: NEGATIVE/-- (06/21 0838)  2 hr GTT  normal  Prenatal Transfer Tool  Maternal Diabetes: No Genetic Screening: Declined Maternal Ultrasounds/Referrals: Normal Fetal Ultrasounds or other Referrals:  None Maternal Substance Abuse:  Yes:  Type: Marijuana Significant Maternal Medications:  None Significant Maternal Lab Results: Group B Strep negative and Rh negative (antibody positive)  Results for orders placed or performed during the hospital encounter of 04/27/20 (from the past 24 hour(s))  Urinalysis, Routine w reflex microscopic   Collection Time: 04/27/20  8:02 AM  Result Value Ref Range   Color, Urine YELLOW YELLOW   APPearance CLEAR CLEAR   Specific Gravity, Urine 1.009 1.005 - 1.030   pH 6.0 5.0 - 8.0   Glucose, UA NEGATIVE NEGATIVE mg/dL   Hgb urine dipstick NEGATIVE NEGATIVE   Bilirubin Urine NEGATIVE NEGATIVE   Ketones, ur NEGATIVE NEGATIVE mg/dL   Protein, ur NEGATIVE NEGATIVE mg/dL   Nitrite NEGATIVE NEGATIVE   Leukocytes,Ua NEGATIVE NEGATIVE  Protein / creatinine ratio, urine   Collection Time: 04/27/20  8:02 AM  Result Value Ref Range   Creatinine, Urine 75.75 mg/dL   Total Protein, Urine 26 mg/dL   Protein Creatinine Ratio 0.34 (H) 0.00 - 0.15 mg/mg[Cre]  Rapid urine drug screen (hospital performed)   Collection Time: 04/27/20  8:02 AM  Result Value Ref Range   Opiates NONE DETECTED NONE DETECTED   Cocaine NONE DETECTED NONE DETECTED   Benzodiazepines NONE DETECTED NONE DETECTED   Amphetamines NONE DETECTED NONE DETECTED   Tetrahydrocannabinol POSITIVE (A) NONE DETECTED   Barbiturates NONE DETECTED NONE DETECTED  CBC   Collection Time: 04/27/20  8:20 AM  Result Value Ref Range   WBC 15.2 (H) 4.0 - 10.5 K/uL   RBC 4.25 3.87 - 5.11 MIL/uL   Hemoglobin 11.7 (L) 12.0 - 15.0 g/dL   HCT 35.9 (L) 36 - 46 %   MCV 84.5 80.0 - 100.0 fL   MCH 27.5 26.0 - 34.0 pg   MCHC 32.6 30.0 - 36.0 g/dL   RDW 14.5 11.5 - 15.5 %   Platelets 209 150 - 400 K/uL   nRBC 0.0 0.0 - 0.2 %  RPR   Collection Time:  04/27/20  8:20 AM  Result Value Ref Range   RPR Ser Ql NON REACTIVE NON REACTIVE  Comprehensive metabolic panel   Collection Time: 04/27/20  8:20 AM  Result Value Ref Range   Sodium 138 135 - 145 mmol/L   Potassium 4.0 3.5 - 5.1 mmol/L   Chloride 108 98 - 111 mmol/L   CO2 19 (L) 22 - 32 mmol/L   Glucose, Bld 95 70 - 99 mg/dL   BUN <5 (L) 6 - 20 mg/dL   Creatinine, Ser  0.64 0.44 - 1.00 mg/dL   Calcium 8.9 8.9 - 16.1 mg/dL   Total Protein 6.2 (L) 6.5 - 8.1 g/dL   Albumin 2.7 (L) 3.5 - 5.0 g/dL   AST 16 15 - 41 U/L   ALT 10 0 - 44 U/L   Alkaline Phosphatase 285 (H) 38 - 126 U/L   Total Bilirubin 0.3 0.3 - 1.2 mg/dL   GFR calc non Af Amer >60 >60 mL/min   GFR calc Af Amer >60 >60 mL/min   Anion gap 11 5 - 15  Type and screen   Collection Time: 04/27/20  8:27 AM  Result Value Ref Range   ABO/RH(D) A NEG    Antibody Screen POS    Sample Expiration 04/30/2020,2359    Antibody Identification      PASSIVELY ACQUIRED ANTI-D Performed at [facility], [address] [place], [place] [address]   SARS Coronavirus 2 by RT PCR (hospital order, performed in [facility] hospital lab) Nasopharyngeal Nasopharyngeal Swab   Collection Time: 04/27/20  8:30 AM   Specimen: Nasopharyngeal Swab  Result Value Ref Range   SARS Coronavirus 2 NEGATIVE NEGATIVE  Group B strep by PCR   Collection Time: 04/27/20  8:38 AM   Specimen: Vaginal/Rectal; Genital  Result Value Ref Range   Group B strep by PCR NEGATIVE NEGATIVE    Patient Active Problem List   Diagnosis Date Noted  . Gestational hypertension affecting first pregnancy 04/27/2020  . Gestational hypertension 04/27/2020  . Rh negative state in antepartum period, third trimester 03/03/2020  . Marijuana use 01/13/2020  . Late prenatal care in second trimester 01/10/2020  . Encounter for supervision of normal pregnancy, antepartum 01/10/2020  . Rubella non-immune status, antepartum 01/10/2020    Assessment: Kelsey Lambert is a 24  y.o. G1P0 at [redacted]w[redacted]d here for SOL with severe range bp.  1. Labor: early 2. FWB: Cat 1 3. Pain: planning epidural  4. GBS: Negative   Plan: Admit to labor and delivery, plan to place epidural. Will consult with Dr. Debroah Loop regarding hypertension management.  Bernerd Limbo, CNM  04/27/2020, 12:07 PM

## 2020-04-27 NOTE — Anesthesia Procedure Notes (Signed)
Epidural Patient location during procedure: OB Start time: 04/27/2020 10:00 AM End time: 04/27/2020 10:11 AM  Staffing Anesthesiologist: Lucretia Kern, MD Performed: anesthesiologist   Preanesthetic Checklist Completed: patient identified, IV checked, risks and benefits discussed, monitors and equipment checked, pre-op evaluation and timeout performed  Epidural Patient position: sitting Prep: DuraPrep Patient monitoring: heart rate, continuous pulse ox and blood pressure Approach: midline Location: L4-L5 Injection technique: LOR air  Needle:  Needle type: Tuohy  Needle gauge: 17 G Needle length: 9 cm Needle insertion depth: 6 cm Catheter type: closed end flexible Catheter size: 19 Gauge Catheter at skin depth: 11 cm Test dose: negative  Assessment Events: blood not aspirated, injection not painful, no injection resistance, no paresthesia and negative IV test  Additional Notes Reason for block:procedure for pain

## 2020-04-28 ENCOUNTER — Encounter (HOSPITAL_COMMUNITY): Payer: Self-pay | Admitting: Obstetrics & Gynecology

## 2020-04-28 DIAGNOSIS — Z3A38 38 weeks gestation of pregnancy: Secondary | ICD-10-CM

## 2020-04-28 DIAGNOSIS — O1414 Severe pre-eclampsia complicating childbirth: Secondary | ICD-10-CM

## 2020-04-28 DIAGNOSIS — O1410 Severe pre-eclampsia, unspecified trimester: Secondary | ICD-10-CM

## 2020-04-28 LAB — PLATELET COUNT: Platelets: 206 10*3/uL (ref 150–400)

## 2020-04-28 MED ORDER — BENZOCAINE-MENTHOL 20-0.5 % EX AERO
1.0000 "application " | INHALATION_SPRAY | CUTANEOUS | Status: DC | PRN
  Administered 2020-04-28: 1 via TOPICAL
  Filled 2020-04-28 (×2): qty 56

## 2020-04-28 MED ORDER — RHO D IMMUNE GLOBULIN 1500 UNIT/2ML IJ SOSY
300.0000 ug | PREFILLED_SYRINGE | Freq: Once | INTRAMUSCULAR | Status: AC
  Administered 2020-04-28: 300 ug via INTRAVENOUS
  Filled 2020-04-28: qty 2

## 2020-04-28 MED ORDER — DIBUCAINE (PERIANAL) 1 % EX OINT: 1.0000 "application " | TOPICAL_OINTMENT | CUTANEOUS | Status: DC | PRN

## 2020-04-28 MED ORDER — IBUPROFEN 600 MG PO TABS
600.0000 mg | ORAL_TABLET | Freq: Four times a day (QID) | ORAL | Status: DC
  Administered 2020-04-28 – 2020-04-30 (×8): 600 mg via ORAL
  Filled 2020-04-28 (×8): qty 1

## 2020-04-28 MED ORDER — LACTATED RINGERS IV SOLN: INTRAVENOUS | Status: DC

## 2020-04-28 MED ORDER — DIPHENHYDRAMINE HCL 25 MG PO CAPS: 25.0000 mg | ORAL_CAPSULE | Freq: Four times a day (QID) | ORAL | Status: DC | PRN

## 2020-04-28 MED ORDER — ACETAMINOPHEN 325 MG PO TABS: 650.0000 mg | ORAL_TABLET | ORAL | Status: DC | PRN

## 2020-04-28 MED ORDER — TETANUS-DIPHTH-ACELL PERTUSSIS 5-2.5-18.5 LF-MCG/0.5 IM SUSP: 0.5000 mL | Freq: Once | INTRAMUSCULAR | Status: DC

## 2020-04-28 MED ORDER — ONDANSETRON HCL 4 MG PO TABS: 4.0000 mg | ORAL_TABLET | ORAL | Status: DC | PRN

## 2020-04-28 MED ORDER — SIMETHICONE 80 MG PO CHEW: 80.0000 mg | CHEWABLE_TABLET | ORAL | Status: DC | PRN

## 2020-04-28 MED ORDER — MAGNESIUM SULFATE 40 GM/1000ML IV SOLN
2.0000 g/h | INTRAVENOUS | Status: AC
  Administered 2020-04-28: 2 g/h via INTRAVENOUS
  Filled 2020-04-28: qty 1000

## 2020-04-28 MED ORDER — OXYCODONE HCL 5 MG PO TABS: 10.0000 mg | ORAL_TABLET | ORAL | Status: DC | PRN

## 2020-04-28 MED ORDER — PRENATAL MULTIVITAMIN CH
1.0000 | ORAL_TABLET | Freq: Every day | ORAL | Status: DC
  Administered 2020-04-28 – 2020-04-29 (×2): 1 via ORAL
  Filled 2020-04-28 (×2): qty 1

## 2020-04-28 MED ORDER — ONDANSETRON HCL 4 MG/2ML IJ SOLN: 4.0000 mg | INTRAMUSCULAR | Status: DC | PRN

## 2020-04-28 MED ORDER — COCONUT OIL OIL: 1.0000 "application " | TOPICAL_OIL | Status: DC | PRN

## 2020-04-28 MED ORDER — ZOLPIDEM TARTRATE 5 MG PO TABS: 5.0000 mg | ORAL_TABLET | Freq: Every evening | ORAL | Status: DC | PRN

## 2020-04-28 MED ORDER — NIFEDIPINE ER OSMOTIC RELEASE 30 MG PO TB24
30.0000 mg | ORAL_TABLET | Freq: Every day | ORAL | Status: DC
  Administered 2020-04-28: 30 mg via ORAL
  Filled 2020-04-28: qty 1

## 2020-04-28 MED ORDER — WITCH HAZEL-GLYCERIN EX PADS: 1.0000 "application " | MEDICATED_PAD | CUTANEOUS | Status: DC | PRN

## 2020-04-28 MED ORDER — MEASLES, MUMPS & RUBELLA VAC IJ SOLR: 0.5000 mL | Freq: Once | INTRAMUSCULAR | Status: DC

## 2020-04-28 MED ORDER — OXYCODONE HCL 5 MG PO TABS: 5.0000 mg | ORAL_TABLET | ORAL | Status: DC | PRN

## 2020-04-28 MED ORDER — PROMETHAZINE HCL 25 MG/ML IJ SOLN
12.5000 mg | Freq: Four times a day (QID) | INTRAMUSCULAR | Status: DC | PRN
  Administered 2020-04-28: 12.5 mg via INTRAVENOUS
  Filled 2020-04-28: qty 1

## 2020-04-28 MED ORDER — SENNOSIDES-DOCUSATE SODIUM 8.6-50 MG PO TABS
2.0000 | ORAL_TABLET | ORAL | Status: DC
  Administered 2020-04-29 – 2020-04-30 (×2): 2 via ORAL
  Filled 2020-04-28 (×2): qty 2

## 2020-04-28 NOTE — Progress Notes (Signed)
CSW acknowledged consult and completed chart review. CSW will meet with MOB to complete psychosocial assessment when MOB's magnesium is discontinued.   Celso Sickle, LCSW Clinical Social Worker [facility] Cell#: [phone]

## 2020-04-28 NOTE — Anesthesia Postprocedure Evaluation (Signed)
Anesthesia Post Note  Patient: Shanon Payor  Procedure(s) Performed: AN AD HOC LABOR EPIDURAL     Patient location during evaluation: Mother Baby Anesthesia Type: Epidural Level of consciousness: awake and alert and oriented Pain management: satisfactory to patient Vital Signs Assessment: post-procedure vital signs reviewed and stable Respiratory status: spontaneous breathing and nonlabored ventilation Cardiovascular status: stable Postop Assessment: no headache, no backache, no signs of nausea or vomiting, adequate PO intake, patient able to bend at knees and able to ambulate (patient up walking) Anesthetic complications: no   No complications documented.  Last Vitals:  Vitals:   04/28/20 1400 04/28/20 1631  BP:  (!) 143/101  Pulse:  85  Resp: 20 20  Temp:  36.7 C  SpO2:  97%    Last Pain:  Vitals:   04/28/20 1631  TempSrc: Oral  PainSc:    Pain Goal: Patients Stated Pain Goal: 3 (04/28/20 1551)                 Madison Hickman

## 2020-04-28 NOTE — H&P (Addendum)
OBSTETRIC ADMISSION HISTORY AND PHYSICAL  Kelsey Lambert is a 24 y.o. female G1P0 with IUP at [redacted]w[redacted]d presenting for early labor with severe range pressures in MAU. She reports +FMs. No LOF, VB, blurry vision, headaches, peripheral edema, or RUQ pain. She plans on breastfeeding. She requests POPs for birth control.  Dating: By LMP --->  Estimated Date of Delivery: 05/08/20  Sono: normal  @[redacted]w[redacted]d, normal anatomy, cephalic presentation, 74%ile, EFW 493g   Prenatal History/Complications: LPNC 23wk Declined genetic testing +THC  Past Medical History: Past Medical History:  Diagnosis Date  . Asthma   . Medical history non-contributory     Past Surgical History: Past Surgical History:  Procedure Laterality Date  . WISDOM TOOTH EXTRACTION      Obstetrical History: OB History    Gravida  1   Para      Term      Preterm      AB      Living        SAB      TAB      Ectopic      Multiple      Live Births              Social History: Social History   Socioeconomic History  . Marital status: Single    Spouse name: Not on file  . Number of children: Not on file  . Years of education: Not on file  . Highest education level: Not on file  Occupational History  . Not on file  Tobacco Use  . Smoking status: Former Smoker  . Smokeless tobacco: Never Used  Vaping Use  . Vaping Use: Never used  Substance and Sexual Activity  . Alcohol use: No  . Drug use: Yes    Types: Marijuana    Comment: last week  . Sexual activity: Not Currently    Birth control/protection: None  Other Topics Concern  . Not on file  Social History Narrative  . Not on file   Social Determinants of Health   Financial Resource Strain: Low Risk   . Difficulty of Paying Living Expenses: Not hard at all  Food Insecurity: No Food Insecurity  . Worried About Running Out of Food in the Last Year: Never true  . Ran Out of Food in the Last Year: Never true  Transportation Needs: No  Transportation Needs  . Lack of Transportation (Medical): No  . Lack of Transportation (Non-Medical): No  Physical Activity: Inactive  . Days of Exercise per Week: 0 days  . Minutes of Exercise per Session: 0 min  Stress: No Stress Concern Present  . Feeling of Stress : Only a little  Social Connections: Unknown  . Frequency of Communication with Friends and Family: Once a week  . Frequency of Social Gatherings with Friends and Family: Not on file  . Attends Religious Services: Never  . Active Member of Clubs or Organizations: No  . Attends Club or Organization Meetings: Never  . Marital Status: Never married    Family History: Family History  Problem Relation Age of Onset  . Diabetes Mother   . COPD Mother   . Cancer Maternal Grandmother        lung  . Cancer Maternal Uncle     Allergies: No Known Allergies  Medications Prior to Admission  Medication Sig Dispense Refill Last Dose  . Blood Pressure Monitor MISC For regular home bp monitoring during pregnancy 1 each 0 Past Week at Unknown time       Review of Systems:  All systems reviewed and negative except as stated in HPI  PE: Blood pressure (!) 138/94, pulse 91, temperature 98.3 F (36.8 C), temperature source Oral, resp. rate 16, height 4' 10.5" (1.486 m), weight 84.4 kg, last menstrual period 08/02/2019, SpO2 98 %. General appearance: alert, cooperative and no distress Lungs: regular rate and effort Heart: regular rate  Abdomen: soft, non-tender Extremities: Homans sign is negative, no sign of DVT Presentation: cephalic EFM: 542 bpm, moderate variability, no accels, no decels Toco: q4-105min Dilation: 5 Effacement (%): 90 Station: -2 Exam by:: August Albino RNC  Prenatal labs: ABO, Rh: --/--/A NEG (06/21 0827) Antibody: POS (06/21 0827) Rubella: 0.91 (02/24 1208) RPR: NON REACTIVE (06/21 0820)  HBsAg: Negative (02/24 1208)  HIV: Non Reactive (04/05 0855)  GBS: NEGATIVE/-- (06/21 0838)  2 hr GTT  normal  Prenatal Transfer Tool  Maternal Diabetes: No Genetic Screening: Declined Maternal Ultrasounds/Referrals: Normal Fetal Ultrasounds or other Referrals:  None Maternal Substance Abuse:  Yes:  Type: Marijuana Significant Maternal Medications:  None Significant Maternal Lab Results: Group B Strep negative and Rh negative (antibody positive)  Results for orders placed or performed during the hospital encounter of 04/27/20 (from the past 24 hour(s))  Urinalysis, Routine w reflex microscopic   Collection Time: 04/27/20  8:02 AM  Result Value Ref Range   Color, Urine YELLOW YELLOW   APPearance CLEAR CLEAR   Specific Gravity, Urine 1.009 1.005 - 1.030   pH 6.0 5.0 - 8.0   Glucose, UA NEGATIVE NEGATIVE mg/dL   Hgb urine dipstick NEGATIVE NEGATIVE   Bilirubin Urine NEGATIVE NEGATIVE   Ketones, ur NEGATIVE NEGATIVE mg/dL   Protein, ur NEGATIVE NEGATIVE mg/dL   Nitrite NEGATIVE NEGATIVE   Leukocytes,Ua NEGATIVE NEGATIVE  Protein / creatinine ratio, urine   Collection Time: 04/27/20  8:02 AM  Result Value Ref Range   Creatinine, Urine 75.75 mg/dL   Total Protein, Urine 26 mg/dL   Protein Creatinine Ratio 0.34 (H) 0.00 - 0.15 mg/mg[Cre]  Rapid urine drug screen (hospital performed)   Collection Time: 04/27/20  8:02 AM  Result Value Ref Range   Opiates NONE DETECTED NONE DETECTED   Cocaine NONE DETECTED NONE DETECTED   Benzodiazepines NONE DETECTED NONE DETECTED   Amphetamines NONE DETECTED NONE DETECTED   Tetrahydrocannabinol POSITIVE (A) NONE DETECTED   Barbiturates NONE DETECTED NONE DETECTED  CBC   Collection Time: 04/27/20  8:20 AM  Result Value Ref Range   WBC 15.2 (H) 4.0 - 10.5 K/uL   RBC 4.25 3.87 - 5.11 MIL/uL   Hemoglobin 11.7 (L) 12.0 - 15.0 g/dL   HCT 35.9 (L) 36 - 46 %   MCV 84.5 80.0 - 100.0 fL   MCH 27.5 26.0 - 34.0 pg   MCHC 32.6 30.0 - 36.0 g/dL   RDW 14.5 11.5 - 15.5 %   Platelets 209 150 - 400 K/uL   nRBC 0.0 0.0 - 0.2 %  RPR   Collection Time:  04/27/20  8:20 AM  Result Value Ref Range   RPR Ser Ql NON REACTIVE NON REACTIVE  Comprehensive metabolic panel   Collection Time: 04/27/20  8:20 AM  Result Value Ref Range   Sodium 138 135 - 145 mmol/L   Potassium 4.0 3.5 - 5.1 mmol/L   Chloride 108 98 - 111 mmol/L   CO2 19 (L) 22 - 32 mmol/L   Glucose, Bld 95 70 - 99 mg/dL   BUN <5 (L) 6 - 20 mg/dL   Creatinine, Ser  0.64 0.44 - 1.00 mg/dL   Calcium 8.9 8.9 - 10.3 mg/dL   Total Protein 6.2 (L) 6.5 - 8.1 g/dL   Albumin 2.7 (L) 3.5 - 5.0 g/dL   AST 16 15 - 41 U/L   ALT 10 0 - 44 U/L   Alkaline Phosphatase 285 (H) 38 - 126 U/L   Total Bilirubin 0.3 0.3 - 1.2 mg/dL   GFR calc non Af Amer >60 >60 mL/min   GFR calc Af Amer >60 >60 mL/min   Anion gap 11 5 - 15  Type and screen   Collection Time: 04/27/20  8:27 AM  Result Value Ref Range   ABO/RH(D) A NEG    Antibody Screen POS    Sample Expiration 04/30/2020,2359    Antibody Identification      PASSIVELY ACQUIRED ANTI-D Performed at [facility], [address] [place], [place] [address]   SARS Coronavirus 2 by RT PCR (hospital order, performed in [place] hospital lab) Nasopharyngeal Nasopharyngeal Swab   Collection Time: 04/27/20  8:30 AM   Specimen: Nasopharyngeal Swab  Result Value Ref Range   SARS Coronavirus 2 NEGATIVE NEGATIVE  Group B strep by PCR   Collection Time: 04/27/20  8:38 AM   Specimen: Vaginal/Rectal; Genital  Result Value Ref Range   Group B strep by PCR NEGATIVE NEGATIVE    Patient Active Problem List   Diagnosis Date Noted  . Gestational hypertension affecting first pregnancy 04/27/2020  . Gestational hypertension 04/27/2020  . Rh negative state in antepartum period, third trimester 03/03/2020  . Marijuana use 01/13/2020  . Late prenatal care in second trimester 01/10/2020  . Encounter for supervision of normal pregnancy, antepartum 01/10/2020  . Rubella non-immune status, antepartum 01/10/2020    Assessment: Kelsey Lambert is a 24  y.o. G1P0 at [redacted]w[redacted]d here for SOL with severe range bp.  1. Labor: early 2. FWB: Cat 1 3. Pain: planning epidural  4. GBS: Negative   Plan: Admit to labor and delivery, plan to place epidural. Will consult with Dr. Arnold regarding hypertension management.  Nyari Olsson R Kaeley Vinje, CNM  04/27/2020, 12:07 PM    

## 2020-04-28 NOTE — Discharge Summary (Signed)
Postpartum Discharge Summary      Patient Name: Kelsey Lambert DOB: [birth year] MRN: [id number]  Date of admission: 04/27/2020 Delivery date:04/28/2020  Delivering provider: Merilyn Baba  Date of discharge: 04/30/2020  Admitting diagnosis: Gestational hypertension [O13.9] Intrauterine pregnancy: [redacted]w[redacted]d    Secondary diagnosis:  Principal Problem:   Severe preeclampsia Active Problems:   Late prenatal care in second trimester   Rubella non-immune status, antepartum   Marijuana use   Rh negative state in antepartum period, third trimester  Additional problems:     Discharge diagnosis: Term Pregnancy Delivered and Preeclampsia (severe)                                              Post partum procedures:none Augmentation: Pitocin Complications: 211BShoulder dystocia (resolved with McRoberts and Wood's Screw)  Hospital course: Onset of Labor With Vaginal Delivery      24y.o. yo G1P0 at 353w4das admitted in Latent Labor on 04/27/2020. Patient had an uncomplicated labor course as follows:   Admitted in early labor after being found to have pre-E with severe features by blood pressure. She was started on pitocin and magnesium. She SROMed and progressed to complete. While she was pushing, there was noted to be fetal decelerations into the 60s with slow recovery. Due to this, the decision was made to proceed with vacuum assisted vaginal delivery.  Membrane Rupture Time/Date: 6:45 PM ,04/27/2020   Delivery Method:Vaginal, Vacuum (Extractor)  Episiotomy: None  Lacerations:  2nd degree  Patient had an uncomplicated postpartum course. She was on magnesium for 24 hours post partum.BP remained elevated and she was started on nifedipine XL 30 mg.  She is ambulating, tolerating a regular diet, passing flatus, and urinating well. Patient is discharged home in stable condition on 04/30/20.  Newborn Data: Birth date:04/28/2020  Birth time:12:44 AM  Gender:Female  Living status:Living   Apgars:7 ,9  Weight:3280 g   Magnesium Sulfate received: Yes: Seizure prophylaxis BMZ received: No Rhophylac:Yes MMR:Yes T-DaP:Given prenatally Flu: N/A Transfusion:No  Physical exam  Vitals:   04/29/20 1943 04/29/20 2003 04/30/20 0007 04/30/20 0559  BP: (!) 155/95 137/86 129/72 136/85  Pulse: 80 79 73 86  Resp: 18 18 18 18   Temp:  98.7 F (37.1 C) 98.2 F (36.8 C) 98 F (36.7 C)  TempSrc:  Oral Oral Oral  SpO2:  100% 99% 100%  Weight:      Height:       General: alert, cooperative and no distress Lochia: appropriate Uterine Fundus: firm Incision: N/A DVT Evaluation: Negative Homan's sign. No cords or calf tenderness. Labs: Lab Results  Component Value Date   WBC 15.2 (H) 04/27/2020   HGB 11.7 (L) 04/27/2020   HCT 35.9 (L) 04/27/2020   MCV 84.5 04/27/2020   PLT 206 04/28/2020   CMP Latest Ref Rng & Units 04/27/2020  Glucose 70 - 99 mg/dL 95  BUN 6 - 20 mg/dL <5(L)  Creatinine 0.44 - 1.00 mg/dL 0.64  Sodium 135 - 145 mmol/L 138  Potassium 3.5 - 5.1 mmol/L 4.0  Chloride 98 - 111 mmol/L 108  CO2 22 - 32 mmol/L 19(L)  Calcium 8.9 - 10.3 mg/dL 8.9  Total Protein 6.5 - 8.1 g/dL 6.2(L)  Total Bilirubin 0.3 - 1.2 mg/dL 0.3  Alkaline Phos 38 - 126 U/L 285(H)  AST 15 - 41 U/L 16  ALT 0 - 44 U/L 10   Edinburgh Score: Edinburgh Postnatal Depression Scale Screening Tool 04/28/2020  I have been able to laugh and see the funny side of things. 0  I have looked forward with enjoyment to things. 1  I have blamed myself unnecessarily when things went wrong. 2  I have been anxious or worried for no good reason. 3  I have felt scared or panicky for no good reason. 2  Things have been getting on top of me. 1  I have been so unhappy that I have had difficulty sleeping. 0  I have felt sad or miserable. 1  I have been so unhappy that I have been crying. 0  The thought of harming myself has occurred to me. 0  Edinburgh Postnatal Depression Scale Total 10     After visit  meds:  Allergies as of 04/30/2020   No Known Allergies     Medication List    TAKE these medications   Blood Pressure Monitor Misc For regular home bp monitoring during pregnancy   ibuprofen 600 MG tablet Commonly known as: ADVIL Take 1 tablet (600 mg total) by mouth every 6 (six) hours.   NIFEdipine 30 MG 24 hr tablet Commonly known as: ADALAT CC Take 1 tablet (30 mg total) by mouth daily.        Discharge home in stable condition Infant Feeding: Breast Infant Disposition:home with mother Discharge instruction: per After Visit Summary and Postpartum booklet. Activity: Advance as tolerated. Pelvic rest for 6 weeks.  Diet: routine diet Future Appointments: Future Appointments  Date Time Provider [place]  06/04/2020 11:30 AM Cresenzo-Dishmon, Joaquim Lai, CNM [facility]   Follow up Visit:   Please schedule this patient for a In person postpartum visit in 4 weeks with the following provider: Any provider. Additional Postpartum F/U: BP check, in 1 week  Low risk pregnancy complicated by: Pre-E w/ severe features (blood pressure) Delivery mode:  Vaginal, Vacuum (Extractor)  Anticipated Birth Control:  POPs   04/30/2020 Griffin Basil, MD

## 2020-04-28 NOTE — Lactation Note (Signed)
This note was copied from a baby's chart. Lactation Consultation Note  Patient Name: Kelsey Lambert [id number] Date: 04/28/2020 Reason for consult: Initial assessment;Primapara;1st time breastfeeding;Early term 37-38.6wks  1439 - 1458 - I conducted an initial consult with Ms. [name] and first time breast feeding patient. I assisted with latching baby [name] in football hold onto the left breast. Ms. [name] states that baby fed just prior to my entry, but she began to cue again as we reviewed hand expression. Colostrum noted with hand expression. Ms. [name] was able to repeat back well.  Baby latched for a few minutes and then fell asleep. Ms. [name] support person was very involved and willing to help with breast feeding.   I recommended breast feeding on demand 8-12 times a day. I reviewed hunger cues and day 1 and day 2 infant feeding patterns. I encouraged Ms. [name] to avoid use of artificial nipples and pacifiers until breast feeding is well established.  All questions answered at this time. Ms. [name] will call for assistance this evening as needed.   Baby [name] has a right clavicle fracture.   Feeding Feeding Type: Breast Fed  LATCH Score Latch: Grasps breast easily, tongue down, lips flanged, rhythmical sucking.  Audible Swallowing: None  Type of Nipple: Everted at rest and after stimulation  Comfort (Breast/Nipple): Soft / non-tender  Hold (Positioning): Assistance needed to correctly position infant at breast and maintain latch.  LATCH Score: 7  Interventions Interventions: Breast feeding basics reviewed;Assisted with latch;Skin to skin;Hand express;Breast compression;Adjust position;Support pillows  Lactation Tools Discussed/Used     Consult Status Consult Status: Follow-up Date: 04/29/20 Follow-up type: In-patient    Walker Shadow 04/28/2020, 2:58 PM

## 2020-04-29 LAB — RH IG WORKUP (INCLUDES ABO/RH)
ABO/RH(D): A NEG
Fetal Screen: NEGATIVE
Gestational Age(Wks): 38
Unit division: 0

## 2020-04-29 MED ORDER — NIFEDIPINE ER OSMOTIC RELEASE 30 MG PO TB24
30.0000 mg | ORAL_TABLET | Freq: Every day | ORAL | Status: DC
  Administered 2020-04-29 – 2020-04-30 (×2): 30 mg via ORAL
  Filled 2020-04-29 (×2): qty 1

## 2020-04-29 MED ORDER — POTASSIUM CHLORIDE CRYS ER 20 MEQ PO TBCR
20.0000 meq | EXTENDED_RELEASE_TABLET | Freq: Every day | ORAL | Status: AC
  Administered 2020-04-29 – 2020-04-30 (×2): 20 meq via ORAL
  Filled 2020-04-29 (×2): qty 1

## 2020-04-29 MED ORDER — FUROSEMIDE 20 MG PO TABS
20.0000 mg | ORAL_TABLET | Freq: Every day | ORAL | Status: AC
  Administered 2020-04-29 – 2020-04-30 (×2): 20 mg via ORAL
  Filled 2020-04-29 (×2): qty 1

## 2020-04-29 MED ORDER — NIFEDIPINE ER OSMOTIC RELEASE 30 MG PO TB24: 60.0000 mg | ORAL_TABLET | Freq: Every day | ORAL | Status: DC

## 2020-04-29 NOTE — Lactation Note (Signed)
This note was copied from a baby's chart. Lactation Consultation Note  Patient Name: Kelsey Lambert [id number] Date: 04/29/2020  Mom breastfeeding Kelsey Lambert in football hold on right breast.  At the end of feed so [name] very sleepy.  [name] came off on her own.  Urged mom to burp her and offer other breast.  Reminded mom both breasts are a meal.  Mom reports she had only been alternating but only doing one breast at a time.  Mom had questions regarding breastmilk storage.  Reviewed guidelines in Understanding Mother and baby and showed mom where to find them.  Discussed cluster feeding. Discussed doing some hand expression and spoon feeding past breastfeeding. Dad able to demo hand expression and able to give [name] some drops from a spoon.  Did not  Discuss THC use and positive urine in baby.  Mom with two visitors in room at this time.  Reminded to feed on cue and 8-12 or more times day.  Left name on board.  Urged to call lactation as needed   Maternal Data    Feeding Feeding Type: Breast Fed  [facility] Score                   Interventions    Lactation Tools Discussed/Used     Consult Status      Neomia Dear 04/29/2020, 12:34 PM

## 2020-04-29 NOTE — Clinical Social Work Maternal (Addendum)
CLINICAL SOCIAL WORK MATERNAL/CHILD NOTE  Patient Details  Name: Kelsey Lambert MRN: [id number] Date of Birth: [birth year]  Date:  04/29/2020  Clinical Social Worker Initiating Note:  Laurey Arrow Date/Time: Initiated:  04/29/20/1248     Child's Name:  Kelsey Lambert   Biological Parents:  Mother, Father (FOB is Gillermina Hu (11/06/1990) [birth year])   Need for Interpreter:  None   Reason for Referral:  Current Substance Use/Substance Use During Pregnancy    Address:  [place]  [address]    Phone number:  [phone] (home)     Additional phone number: FOB's number is [phone]  Household Members/Support Persons (HM/SP):    (MOB reported that she lives with her mom and her sister.)   HM/SP Name Relationship DOB or Age  HM/SP -1        HM/SP -2        HM/SP -3        HM/SP -4        HM/SP -5        HM/SP -6        HM/SP -7        HM/SP -8          Natural Supports (not living in the home):  Extended Family, Parent, Spouse/significant other, Friends, Immediate Family   Professional Supports: None   Employment: Unemployed   Type of Work:     Education:  9 to 11 years   Homebound arranged: No  Financial Resources:  [employer]   Other Resources:  Physicist, medical , [place] Considerations Which May Impact Care: None reported Strengths:  Ability to meet basic needs , Engineer, materials, Home prepared for child    Psychotropic Medications:         Pediatrician:    [facility] area  Pediatrician List:   [place]      Pediatrician Fax Number:    Risk Factors/Current Problems:  Substance Use    Cognitive State:  Alert , Able to Concentrate , Linear Thinking , Insightful , Goal Oriented    Mood/Affect:  Bright , Happy , Interested , Comfortable    CSW Assessment: CSW met with MOB in  room 106 to complete an assessment for a consult for hx of THC use in pregnancy.  When CSW arrived, MOB was resting in bed, FOB was on the couch, and a unidentified female was attending to infant.  CSW explained CSW's role and with MOB's permission, CSW asked MOB's guest to leave in order to meet with MOB in private.  MOB was polite and was receptive with meeting with CSW.    CSW inquired about MOB's substance use, and MOB reported utilizing marijuana during pregnancy to assist with decreasing MOB's nausea and increasing her appetite. MOB reported her last use of a marijuana was about 2 weeks ago. MOB denied the use of all other illicit substances. CSW informed MOB of the hospital's drug screen policy. MOB was made aware of the 2 drug screenings for the infant.  MOB was understanding and did not have any questions.  CSW shared with MOB that the infant's UDS is positive and CSW will make a report to [place] (report made to CPS intake worker Kara Pacer.). MOB is aware the CSW will continue to monitor the infant's CDS and will update [facility] CPS. CSW  offered MOB resources and referrals for substance interventions and MOB declined. MOB also denied having any CPS hx. MOB did not have any questions about the hospital's policy and reported having all essential items for infant.  There are no barriers to discharge.   CPS will follow-up with family within 72 hours.   CSW Plan/Description:  Psychosocial Support and Ongoing Assessment of Needs, Sudden Infant Death Syndrome (SIDS) Education, Perinatal Mood and Anxiety Disorder (PMADs) Education, Other Patient/Family Education, [place], Other Information/Referral to [employer], Child Protective Service Report , CSW Will Continue to Monitor Umbilical Cord Tissue Drug Screen Results and Make Report if Warranted   Laurey Arrow, MSW, LCSW Clinical Social Work [phone]  Dimple Nanas,  LCSW 04/29/2020, 1:00 PM

## 2020-04-29 NOTE — Progress Notes (Signed)
Post Partum Day 1 after VAVD at [redacted]w[redacted]d; had severe preeclampsia Subjective: Patient denies any headaches, visual symptoms, RUQ/epigastric pain or other concerning symptoms. Up ad lib, voiding, tolerating PO and + flatus. Baby is doing well, had broken clavicle during delivery that was complicated by brief shoulder dystocia. Moving limbs well.   Objective: Blood pressure (!) 141/106, pulse 80, temperature 97.8 F (36.6 C), temperature source Oral, resp. rate 18, height 4' 10.5" (1.486 m), weight 84.4 kg, last menstrual period 08/02/2019, SpO2 95 %, unknown if currently breastfeeding. Physical Exam:  General: alert and no distress Lochia: appropriate Uterine Fundus: firm DVT Evaluation: No evidence of DVT seen on physical exam. Mild BLE edema. Negative Homan's sign. No cords or calf tenderness.  Recent Labs    04/27/20 0820  HGB 11.7*  HCT 35.9*    Assessment/Plan: Nifedipine XR 30 mg dose to be given now, can increase later if needed Lasix 20 mg po qd x 2 doses ordered for edema, also ordered Kdur Plan for discharge tomorrow if stable Breastfeeding support Contraception POPs Routine postpartum care   LOS: 2 days   Jaynie Collins, MD 04/29/2020, 5:53 AM

## 2020-04-29 NOTE — Lactation Note (Signed)
This note was copied from a baby's chart. Lactation Consultation Note Baby 24 hrs old. Baby not interested at all in BF. Will not take colostrum from spoon. At one time baby acted as if trying to spit up but didn't.  Mom has large pendulous breast w/short shaft nipples. Discussed latching and support. Hand expression w/colostrum easily collected. Praised mom.  Newborn behavior, STS, I&O, milk storage, breast massage, supply and demand discussed. Placed baby in football hold to attempted to latch. Baby to sleepy. Placed baby on mom's chest STS.  Mom encouraged to feed baby 8-12 times/24 hours and with feeding cues. Mom encouraged to waken baby for feeds.   Mom shown how to use DEBP & how to disassemble, clean, & reassemble parts. Mom knows to pump q3h for 15-20 min.  Mom just coming off of Mag. Mom stated now she hopes she will be feeling better. Encouraged mom to call for assistance.  Lactation brochure given.  Patient Name: Kelsey Lambert [id number] Date: 04/29/2020 Reason for consult: Follow-up assessment;Primapara;Early term 37-38.6wks   Maternal Data Has patient been taught Hand Expression?: Yes Does the patient have breastfeeding experience prior to this delivery?: No  Feeding Feeding Type: Breast Fed  LATCH Score Latch: Too sleepy or reluctant, no latch achieved, no sucking elicited.  Audible Swallowing: None  Type of Nipple: Everted at rest and after stimulation (short shaft)  Comfort (Breast/Nipple): Soft / non-tender  Hold (Positioning): Full assist, staff holds infant at breast  LATCH Score: 4  Interventions Interventions: Breast feeding basics reviewed;Support pillows;Assisted with latch;Position options;Skin to skin;Expressed milk;Breast massage;Hand express;Shells;Pre-pump if needed;DEBP;Breast compression;Adjust position;Hand pump  Lactation Tools Discussed/Used Tools: Shells;Pump Shell Type: Inverted Breast pump type: Double-Electric Breast Pump WIC  Program: No Pump Review: Setup, frequency, and cleaning;Milk Storage Initiated by:: Peri Jefferson RN IBCLC Date initiated:: 04/29/20   Consult Status Consult Status: Follow-up Date: 04/29/20 Follow-up type: In-patient    Jenesys Casseus, Diamond Nickel 04/29/2020, 1:19 AM

## 2020-04-29 NOTE — Plan of Care (Signed)
  Problem: Education: Goal: Knowledge of condition will improve Outcome: Progressing   

## 2020-04-30 MED ORDER — NIFEDIPINE ER 30 MG PO TB24: 30.0000 mg | ORAL_TABLET | Freq: Every day | ORAL | 1 refills | Status: DC

## 2020-04-30 MED ORDER — IBUPROFEN 600 MG PO TABS: 600.0000 mg | ORAL_TABLET | Freq: Four times a day (QID) | ORAL | 0 refills | Status: DC

## 2020-04-30 MED FILL — IBUPROFEN 600 MG TABLET: 600 | 7 days supply | Qty: 30 | Fill #0

## 2020-04-30 MED FILL — NIFEdipine ER 30 MG TB24: 30 | 30 days supply | Qty: 30 | Fill #0

## 2020-04-30 NOTE — Progress Notes (Signed)
CSW escorted [place] CPS worker, Richard C. to MOB's room to initiate a safety disposition plan for infant. CPS reported that there are no barriers to infant discharging to MOB and FOB when infant is medically ready. CPS will continue to offer family resources and supports post discharge.   Japji Kok Boyd-Gilyard, MSW, LCSW Clinical Social Work [phone] 

## 2020-04-30 NOTE — Progress Notes (Signed)
Discharge instructions and prescriptions given to pt. Discussed post vaginal delivery care, signs and symptoms to report to the MD, upcoming appointments, and meds. Pt verbalizes understanding and has no questions or concerns at this time. Pt discharged with infant in stable condition.

## 2020-04-30 NOTE — Lactation Note (Signed)
This note was copied from a baby's chart. Lactation Consultation Note  Patient Name: Kelsey Lambert [id number] Date: 04/30/2020 Reason for consult: Follow-up assessment;Primapara;1st time breastfeeding;Early term 37-38.6wks  0745 - 0757 - I followed up with Kelsey Lambert. Baby Kelsey Lambert fed prior to entry and was resting beside Kelsey Lambert. Possible discharge today. I reviewed feeding plan. Baby has been feeding well per mom. She has a DEBP set up. She used it once yesterday and one overnight to supplement baby with her EBM when baby appeared hungry after breast feeding. She is now pumping 30 mls.  She will be picking up her WIC pump today or tomorrow. She plans to breast feed exclusively and pump/supplement as needed.  Her support person is very involved. They had their [place] guide open to the milk storage guidelines which we reviewed.  I reviewed discharge education including how to manage engorgement and our lactation resources. Kelsey Lambert declines OP referral at this time but will call as needed. Encouraged her to join the support group.  I reviewed pump settings and encouraged her to switch to the maintenance phase.  Follow up with be tomorrow with [facility].   We also discussed signs that baby is getting enough to eat. Kelsey Lambert reports hearing audible swallows when baby is feeding and softening of breast tissue.  All questions answered at this time.   Maternal Data Formula Feeding for Exclusion: No Has patient been taught Hand Expression?: Yes Does the patient have breastfeeding experience prior to this delivery?: No  Feeding Feeding Type: Breast Fed   Interventions Interventions: Breast feeding basics reviewed  Lactation Tools Discussed/Used Pump Review: Setup, frequency, and cleaning;Milk Storage   Consult Status Consult Status: Complete Date: 04/30/20 Follow-up type: In-patient    Walker Shadow 04/30/2020, 8:43 AM

## 2020-04-30 NOTE — Discharge Instructions (Signed)
Preeclampsia and Eclampsia Preeclampsia is a serious condition that may develop during pregnancy. This condition causes high blood pressure and increased protein in your urine along with other symptoms, such as headaches and vision changes. These symptoms may develop as the condition gets worse. Preeclampsia may occur at 20 weeks of pregnancy or later. Diagnosing and treating preeclampsia early is very important. If not treated early, it can cause serious problems for you and your baby. One problem it can lead to is eclampsia. Eclampsia is a condition that causes muscle jerking or shaking (convulsions or seizures) and other serious problems for the mother. During pregnancy, delivering your baby may be the best treatment for preeclampsia or eclampsia. For most women, preeclampsia and eclampsia symptoms go away after giving birth. In rare cases, a woman may develop preeclampsia after giving birth (postpartum preeclampsia). This usually occurs within 48 hours after childbirth but may occur up to 6 weeks after giving birth. What are the causes? The cause of preeclampsia is not known. What increases the risk? The following risk factors make you more likely to develop preeclampsia:  Being pregnant for the first time.  Having had preeclampsia during a past pregnancy.  Having a family history of preeclampsia.  Having high blood pressure.  Being pregnant with more than one baby.  Being 35 or older.  Being African-American.  Having kidney disease or diabetes.  Having medical conditions such as lupus or blood diseases.  Being very overweight (obese). What are the signs or symptoms? The most common symptoms are:  Severe headaches.  Vision problems, such as blurred or double vision.  Abdominal pain, especially upper abdominal pain. Other symptoms that may develop as the condition gets worse include:  Sudden weight gain.  Sudden swelling of the hands, face, legs, and feet.  Severe nausea  and vomiting.  Numbness in the face, arms, legs, and feet.  Dizziness.  Urinating less than usual.  Slurred speech.  Convulsions or seizures. How is this diagnosed? There are no screening tests for preeclampsia. Your health care provider will ask you about symptoms and check for signs of preeclampsia during your prenatal visits. You may also have tests that include:  Checking your blood pressure.  Urine tests to check for protein. Your health care provider will check for this at every prenatal visit.  Blood tests.  Monitoring your baby's heart rate.  Ultrasound. How is this treated? You and your health care provider will determine the treatment approach that is best for you. Treatment may include:  Having more frequent prenatal exams to check for signs of preeclampsia, if you have an increased risk for preeclampsia.  Medicine to lower your blood pressure.  Staying in the hospital, if your condition is severe. There, treatment will focus on controlling your blood pressure and the amount of fluids in your body (fluid retention).  Taking medicine (magnesium sulfate) to prevent seizures. This may be given as an injection or through an IV.  Taking a low-dose aspirin during your pregnancy.  Delivering your baby early. You may have your labor started with medicine (induced), or you may have a cesarean delivery. Follow these instructions at home: Eating and drinking   Drink enough fluid to keep your urine pale yellow.  Avoid caffeine. Lifestyle  Do not use any products that contain nicotine or tobacco, such as cigarettes and e-cigarettes. If you need help quitting, ask your health care provider.  Do not use alcohol or drugs.  Avoid stress as much as possible. Rest and get   plenty of sleep. General instructions  Take over-the-counter and prescription medicines only as told by your health care provider.  When lying down, lie on your left side. This keeps pressure off your  major blood vessels.  When sitting or lying down, raise (elevate) your feet. Try putting some pillows underneath your lower legs.  Exercise regularly. Ask your health care provider what kinds of exercise are best for you.  Keep all follow-up and prenatal visits as told by your health care provider. This is important. How is this prevented? There is no known way of preventing preeclampsia or eclampsia from developing. However, to lower your risk of complications and detect problems early:  Get regular prenatal care. Your health care provider may be able to diagnose and treat the condition early.  Maintain a healthy weight. Ask your health care provider for help managing weight gain during pregnancy.  Work with your health care provider to manage any long-term (chronic) health conditions you have, such as diabetes or kidney problems.  You may have tests of your blood pressure and kidney function after giving birth.  Your health care provider may have you take low-dose aspirin during your next pregnancy. Contact a health care provider if:  You have symptoms that your health care provider told you may require more treatment or monitoring, such as: ? Headaches. ? Nausea or vomiting. ? Abdominal pain. ? Dizziness. ? Light-headedness. Get help right away if:  You have severe: ? Abdominal pain. ? Headaches that do not get better. ? Dizziness. ? Vision problems. ? Confusion. ? Nausea or vomiting.  You have any of the following: ? A seizure. ? Sudden, rapid weight gain. ? Sudden swelling in your hands, ankles, or face. ? Trouble moving any part of your body. ? Numbness in any part of your body. ? Trouble speaking. ? Abnormal bleeding.  You faint. Summary  Preeclampsia is a serious condition that may develop during pregnancy.  This condition causes high blood pressure and increased protein in your urine along with other symptoms, such as headaches and vision  changes.  Diagnosing and treating preeclampsia early is very important. If not treated early, it can cause serious problems for you and your baby.  Get help right away if you have symptoms that your health care provider told you to watch for. This information is not intended to replace advice given to you by your health care provider. Make sure you discuss any questions you have with your health care provider. Document Revised: 06/26/2018 Document Reviewed: 05/30/2016 Elsevier Patient Education  2020 Elsevier Inc.  Postpartum Care After Vaginal Delivery This sheet gives you information about how to care for yourself from the time you deliver your baby to up to 6-12 weeks after delivery (postpartum period). Your health care provider may also give you more specific instructions. If you have problems or questions, contact your health care provider. Follow these instructions at home: Vaginal bleeding  It is normal to have vaginal bleeding (lochia) after delivery. Wear a sanitary pad for vaginal bleeding and discharge. ? During the first week after delivery, the amount and appearance of lochia is often similar to a menstrual period. ? Over the next few weeks, it will gradually decrease to a dry, yellow-brown discharge. ? For most women, lochia stops completely by 4-6 weeks after delivery. Vaginal bleeding can vary from woman to woman.  Change your sanitary pads frequently. Watch for any changes in your flow, such as: ? A sudden increase in volume. ? A   change in color. ? Large blood clots.  If you pass a blood clot from your vagina, save it and call your health care provider to discuss. Do not flush blood clots down the toilet before talking with your health care provider.  Do not use tampons or douches until your health care provider says this is safe.  If you are not breastfeeding, your period should return 6-8 weeks after delivery. If you are feeding your child breast milk only (exclusive  breastfeeding), your period may not return until you stop breastfeeding. Perineal care  Keep the area between the vagina and the anus (perineum) clean and dry as told by your health care provider. Use medicated pads and pain-relieving sprays and creams as directed.  If you had a cut in the perineum (episiotomy) or a tear in the vagina, check the area for signs of infection until you are healed. Check for: ? More redness, swelling, or pain. ? Fluid or blood coming from the cut or tear. ? Warmth. ? Pus or a bad smell.  You may be given a squirt bottle to use instead of wiping to clean the perineum area after you go to the bathroom. As you start healing, you may use the squirt bottle before wiping yourself. Make sure to wipe gently.  To relieve pain caused by an episiotomy, a tear in the vagina, or swollen veins in the anus (hemorrhoids), try taking a warm sitz bath 2-3 times a day. A sitz bath is a warm water bath that is taken while you are sitting down. The water should only come up to your hips and should cover your buttocks. Breast care  Within the first few days after delivery, your breasts may feel heavy, full, and uncomfortable (breast engorgement). Milk may also leak from your breasts. Your health care provider can suggest ways to help relieve the discomfort. Breast engorgement should go away within a few days.  If you are breastfeeding: ? Wear a bra that supports your breasts and fits you well. ? Keep your nipples clean and dry. Apply creams and ointments as told by your health care provider. ? You may need to use breast pads to absorb milk that leaks from your breasts. ? You may have uterine contractions every time you breastfeed for up to several weeks after delivery. Uterine contractions help your uterus return to its normal size. ? If you have any problems with breastfeeding, work with your health care provider or lactation consultant.  If you are not breastfeeding: ? Avoid  touching your breasts a lot. Doing this can make your breasts produce more milk. ? Wear a good-fitting bra and use cold packs to help with swelling. ? Do not squeeze out (express) milk. This causes you to make more milk. Intimacy and sexuality  Ask your health care provider when you can engage in sexual activity. This may depend on: ? Your risk of infection. ? How fast you are healing. ? Your comfort and desire to engage in sexual activity.  You are able to get pregnant after delivery, even if you have not had your period. If desired, talk with your health care provider about methods of birth control (contraception). Medicines  Take over-the-counter and prescription medicines only as told by your health care provider.  If you were prescribed an antibiotic medicine, take it as told by your health care provider. Do not stop taking the antibiotic even if you start to feel better. Activity  Gradually return to your normal activities as   told by your health care provider. Ask your health care provider what activities are safe for you.  Rest as much as possible. Try to rest or take a nap while your baby is sleeping. Eating and drinking   Drink enough fluid to keep your urine pale yellow.  Eat high-fiber foods every day. These may help prevent or relieve constipation. High-fiber foods include: ? Whole grain cereals and breads. ? Brown rice. ? Beans. ? Fresh fruits and vegetables.  Do not try to lose weight quickly by cutting back on calories.  Take your prenatal vitamins until your postpartum checkup or until your health care provider tells you it is okay to stop. Lifestyle  Do not use any products that contain nicotine or tobacco, such as cigarettes and e-cigarettes. If you need help quitting, ask your health care provider.  Do not drink alcohol, especially if you are breastfeeding. General instructions  Keep all follow-up visits for you and your baby as told by your health care  provider. Most women visit their health care provider for a postpartum checkup within the first 3-6 weeks after delivery. Contact a health care provider if:  You feel unable to cope with the changes that your child brings to your life, and these feelings do not go away.  You feel unusually sad or worried.  Your breasts become red, painful, or hard.  You have a fever.  You have trouble holding urine or keeping urine from leaking.  You have little or no interest in activities you used to enjoy.  You have not breastfed at all and you have not had a menstrual period for 12 weeks after delivery.  You have stopped breastfeeding and you have not had a menstrual period for 12 weeks after you stopped breastfeeding.  You have questions about caring for yourself or your baby.  You pass a blood clot from your vagina. Get help right away if:  You have chest pain.  You have difficulty breathing.  You have sudden, severe leg pain.  You have severe pain or cramping in your lower abdomen.  You bleed from your vagina so much that you fill more than one sanitary pad in one hour. Bleeding should not be heavier than your heaviest period.  You develop a severe headache.  You faint.  You have blurred vision or spots in your vision.  You have bad-smelling vaginal discharge.  You have thoughts about hurting yourself or your baby. If you ever feel like you may hurt yourself or others, or have thoughts about taking your own life, get help right away. You can go to the nearest emergency department or call:  Your local emergency services (911 in the [place]  A suicide crisis helpline, such as the National Suicide Prevention Lifeline at 1-800-273-8255. This is open 24 hours a day. Summary  The period of time right after you deliver your newborn up to 6-12 weeks after delivery is called the postpartum period.  Gradually return to your normal activities as told by your health care  provider.  Keep all follow-up visits for you and your baby as told by your health care provider. This information is not intended to replace advice given to you by your health care provider. Make sure you discuss any questions you have with your health care provider. Document Revised: 10/27/2017 Document Reviewed: 08/07/2017 Elsevier Patient Education  2020 Elsevier Inc.  

## 2020-04-30 NOTE — Progress Notes (Signed)
POSTPARTUM PROGRESS NOTE  Subjective: Kelsey Lambert is a 24 y.o. G1P1001 s/p vaginal delivery at [redacted]w[redacted]d.  She reports she doing well. No acute events overnight. She denies any problems with ambulating, voiding or po intake. Denies nausea or vomiting. She has  passed flatus. Pain is well controlled.  Lochia is normal per pt.  She denies headache, visual changes and RUQ pain.  She is doing well with her procardia XL  Objective: Blood pressure 136/85, pulse 86, temperature 98 F (36.7 C), temperature source Oral, resp. rate 18, height 4' 10.5" (1.486 m), weight 84.4 kg, last menstrual period 08/02/2019, SpO2 100 %, unknown if currently breastfeeding.  Physical Exam:  General: alert, cooperative and no distress Chest: no respiratory distress Abdomen: soft, non-tender  Uterine Fundus: firm, appropriately tender Extremities: No calf swelling or tenderness   Recent Labs    04/27/20 0820  HGB 11.7*  HCT 35.9*    Assessment/Plan: Kelsey Lambert is a 24 y.o. G1P1001 s/p vaginal delivery with dystocia at [redacted]w[redacted]d for gestational hypertension with superimposed preeclampsia.  Routine Postpartum Care: Doing well, pain well-controlled.  -- Continue routine care, lactation support  -- Contraception: progesterone only pills  Mariel Aloe, MD [facility] [employer]

## 2020-05-21 IMAGING — US US OB < 14 WEEKS - US OB TV
1 series · 14 of 28 positions shown · non-contrast
Comparison: None.

CLINICAL DATA: Patient with lower back pain.

EXAM:
OBSTETRIC <14 WK US AND TRANSVAGINAL OB US
TECHNIQUE: Both transabdominal and transvaginal ultrasound examinations were
performed for complete evaluation of the gestation as well as the
maternal uterus, adnexal regions, and pelvic cul-de-sac.
Transvaginal technique was performed to assess early pregnancy.

[Series 1: us ob < 14 weeks - us ob tv · 0.23mm/px · 14 of 158 slices shown]
[im 6/158]
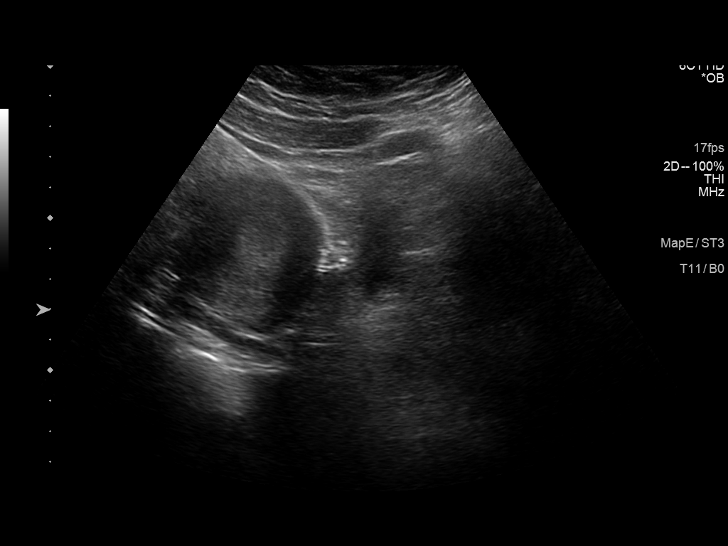
[im 18/158]
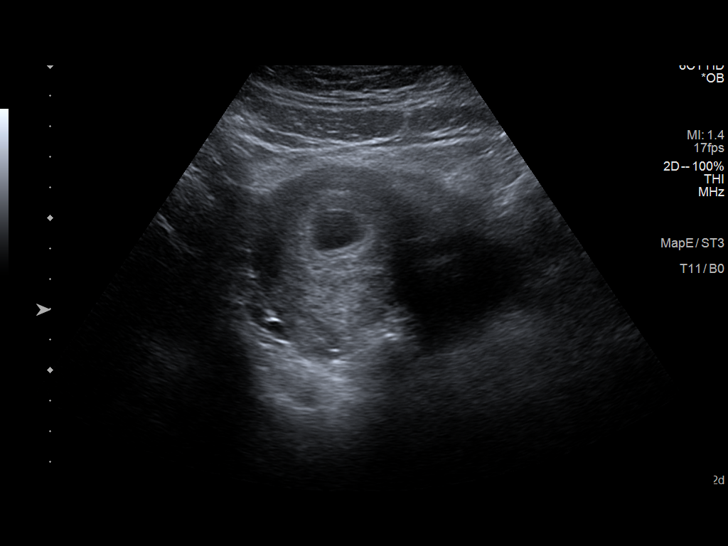
[im 30/158]
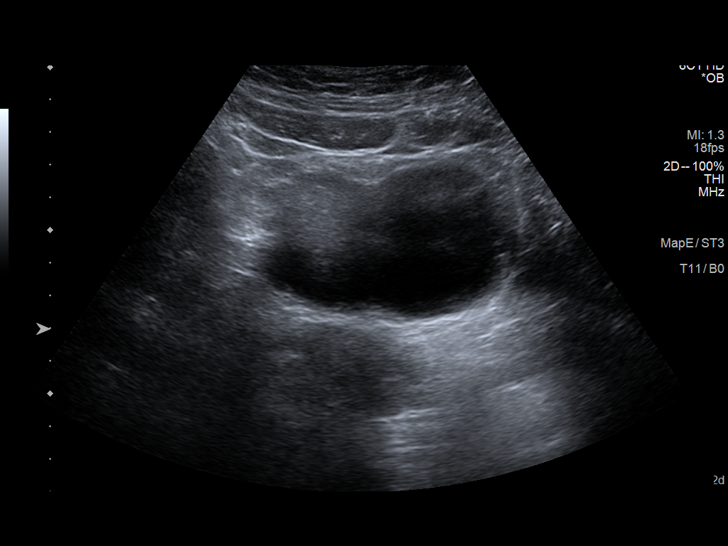
[im 41/158]
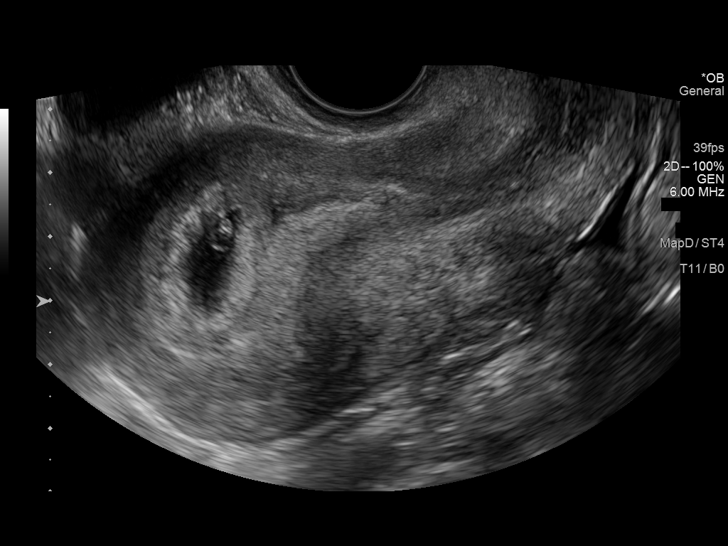
[im 53/158]
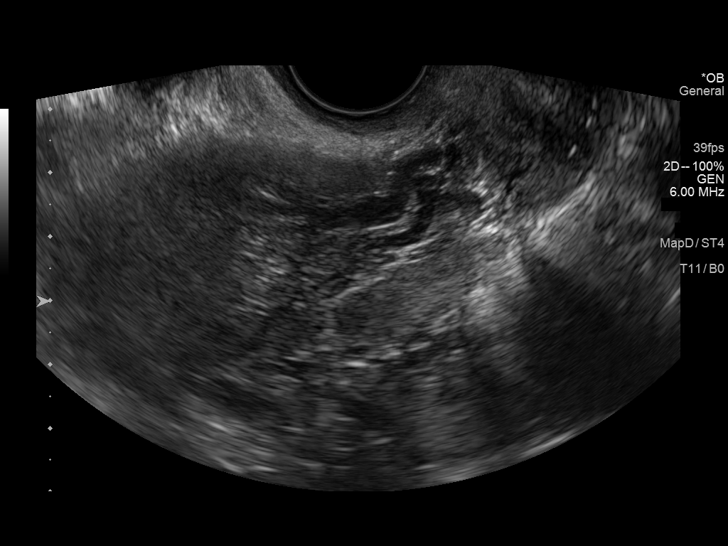
[im 64/158]
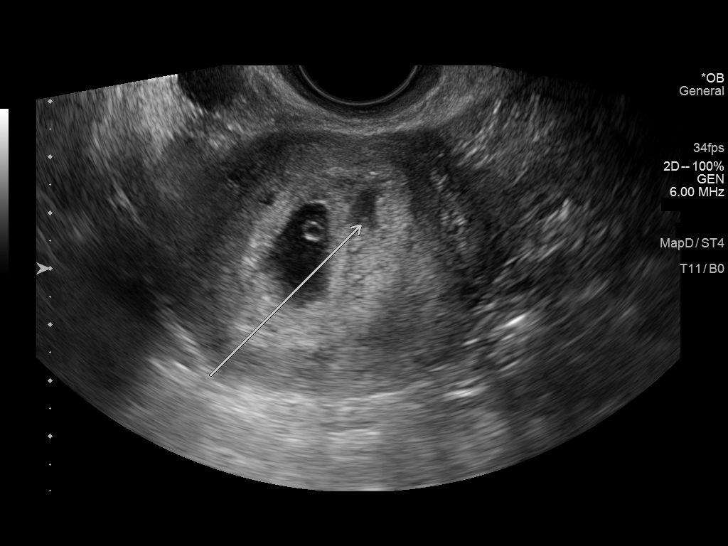
[im 76/158]
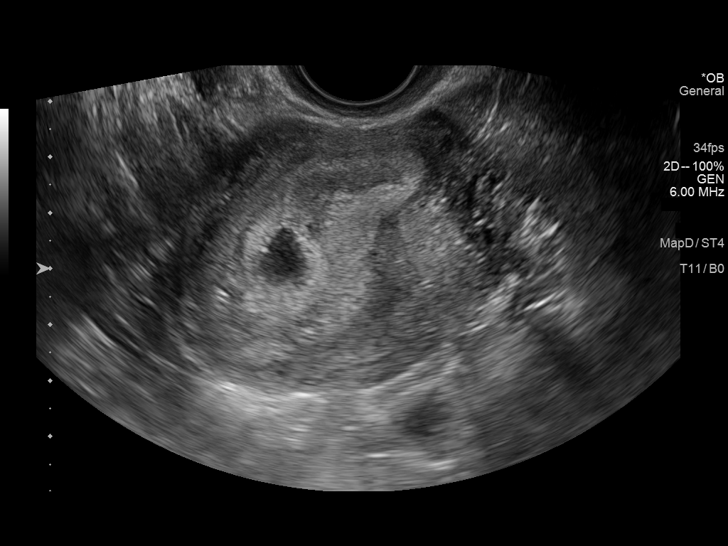
[im 88/158]
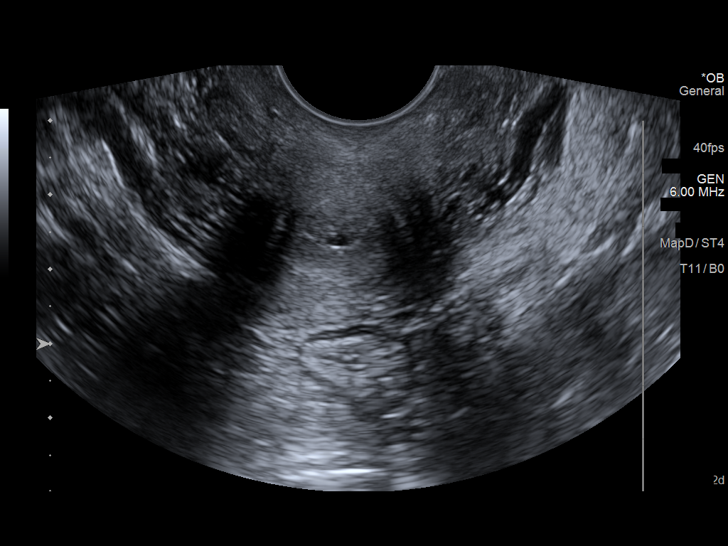
[im 99/158]
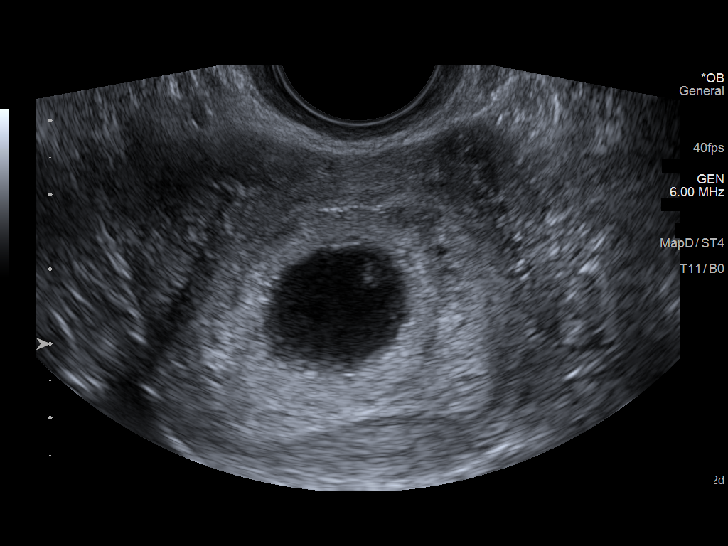
[im 111/158]
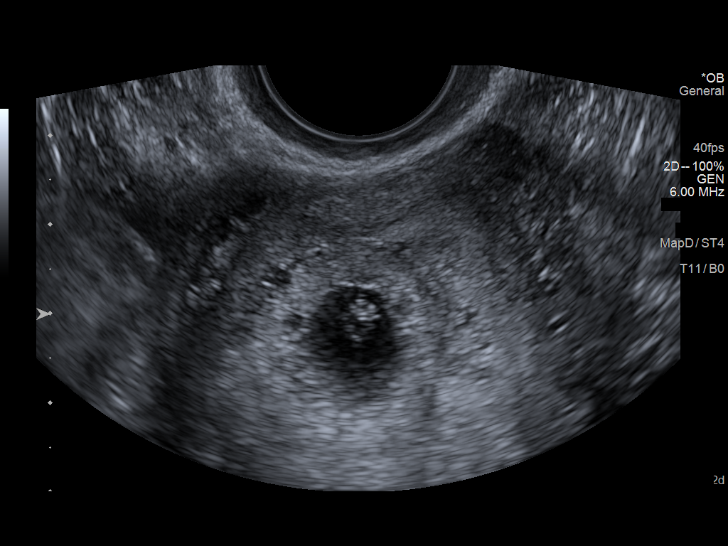
[im 123/158]
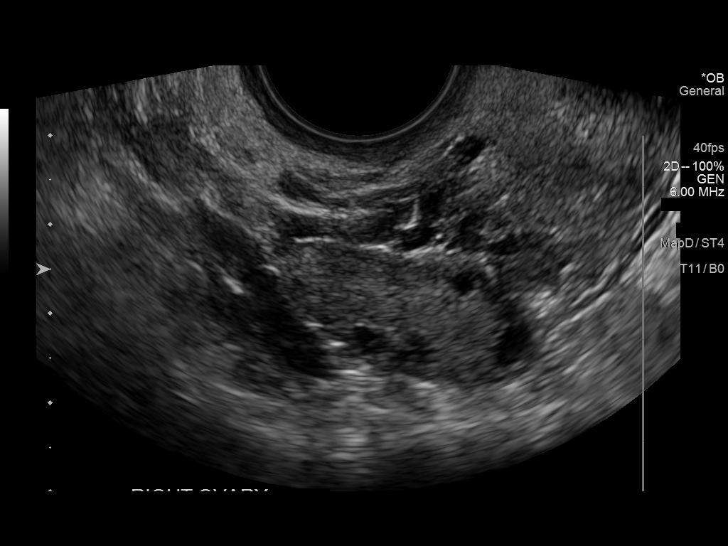
[im 134/158]
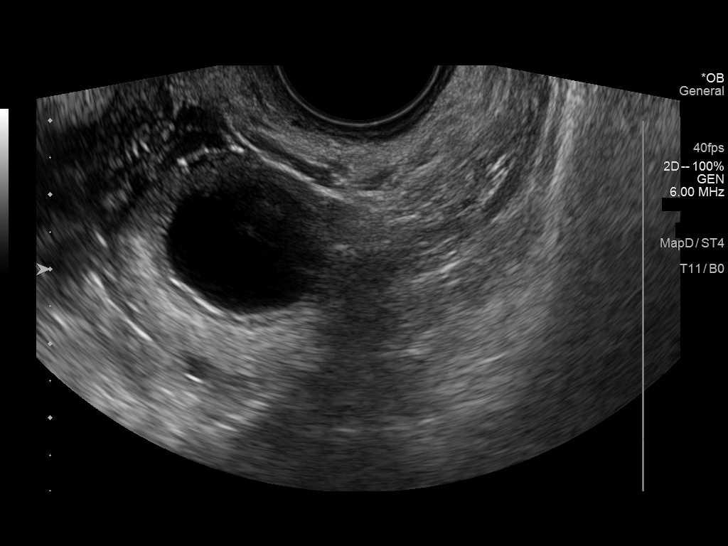
[im 146/158]
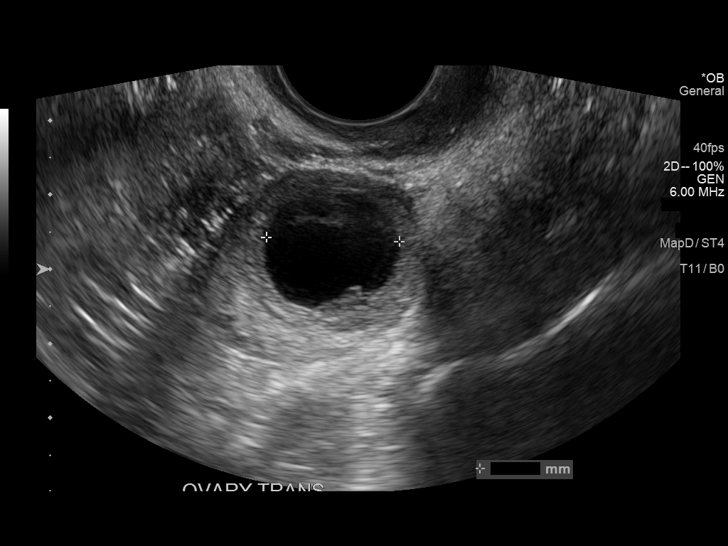
[im 158/158]
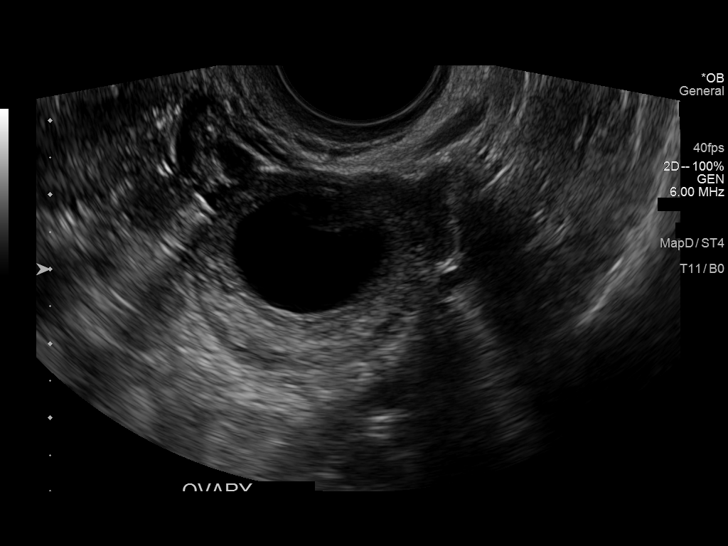

[14 of 28 positions shown; findings below may reference images not displayed]

FINDINGS: Intrauterine gestational sac: Single

Yolk sac:  Visualized.

Embryo:  Visualized.

Cardiac Activity: Visualized.

Heart Rate: 136 bpm

CRL:  3.6 mm   6 w   0 d                  US EDC: 05/09/2020

Subchorionic hemorrhage:  Moderate

Maternal uterus/adnexae: Normal right ovary. Probable corpus luteum
within the left ovary. Trace free fluid in the pelvis.
IMPRESSION: Single live intrauterine gestation. Moderate subchorionic
hemorrhage.

## 2020-06-04 ENCOUNTER — Ambulatory Visit: Payer: Medicaid Other | Admitting: Advanced Practice Midwife

## 2021-11-07 NOTE — L&D Delivery Note (Signed)
Delivery Note Kelsey Lambert is a 26 y.o. G2P1001 at [redacted]w[redacted]d admitted for IOL d/t GHTN.   GBS Status: Negative/-- (10/10 1430) Maximum Maternal Temperature: 98.1  Labor course: Initial SVE: 1.5/50/-3. Augmentation with: AROM, Pitocin, and IP Foley. She then progressed to complete.  ROM: 11h 65m with clear fluid  Birth: At 0316 a viable female was delivered via spontaneous vaginal delivery (Presentation: LOA). Nuchal cord present: Yes nuchal/arm, loose, delivered through and reduced immediately after birth.  Shoulders and body delivered in usual fashion. Infant placed directly on mom's abdomen for bonding/skin-to-skin, baby dried and stimulated. Cord clamped x 2 after 1 minute and cut by pt.  Cord blood collected.  The placenta separated spontaneously and delivered via gentle cord traction.  Pitocin infused rapidly IV per protocol.  Fundus firm with massage.  Placenta inspected and appears to be intact with a 3 VC.  Placenta/Cord with the following complications: none .  Cord pH: not done Sponge and instrument count were correct x2.  Intrapartum complications:  None Anesthesia:  epidural Episiotomy: none Lacerations:  none Suture Repair:  n/a EBL (mL): 38   Infant: APGAR (1 MIN): 9   APGAR (5 MINS): 9   APGAR (10 MINS):    Infant weight: pending  Mom to postpartum.  Baby to Couplet care / Skin to Skin. Placenta to L&D   Plans to Breastfeed Contraception: tubal ligation, NPO for now, will discuss w/ Dr. Elly Modena about time Circumcision: N/A  Note sent to [facility]: FT for pp visit.  [place], [facility] 08/31/2022 3:30 AM

## 2022-03-01 ENCOUNTER — Other Ambulatory Visit: Payer: Self-pay | Admitting: Adult Health

## 2022-03-01 ENCOUNTER — Ambulatory Visit (INDEPENDENT_AMBULATORY_CARE_PROVIDER_SITE_OTHER): Payer: Medicaid Other

## 2022-03-01 VITALS — BP 130/86 | HR 95 | Ht 58.5 in | Wt 163.6 lb

## 2022-03-01 DIAGNOSIS — Z3201 Encounter for pregnancy test, result positive: Secondary | ICD-10-CM

## 2022-03-01 DIAGNOSIS — Z3200 Encounter for pregnancy test, result unknown: Secondary | ICD-10-CM

## 2022-03-01 LAB — POCT URINE PREGNANCY: Preg Test, Ur: POSITIVE — AB

## 2022-03-01 MED ORDER — PROMETHAZINE HCL 25 MG PO TABS: 25.0000 mg | ORAL_TABLET | Freq: Four times a day (QID) | ORAL | 1 refills | Status: DC | PRN

## 2022-03-01 NOTE — Progress Notes (Signed)
Will rx phenergan  

## 2022-03-01 NOTE — Progress Notes (Signed)
? ?  NURSE VISIT- PREGNANCY CONFIRMATION  ? ?SUBJECTIVE:  ?Kelsey Lambert is a 26 y.o. G2P1001 female at [redacted]w[redacted]d by certain LMP of Patient's last menstrual period was 12/05/2021 (exact date). Here for pregnancy confirmation.  Home pregnancy test: positive x 5   She reports nausea.  She is taking prenatal vitamins.   ? ?OBJECTIVE:  ?BP 130/86 (BP Location: Right Arm, Patient Position: Sitting, Cuff Size: Normal)   Pulse 95   Ht 4' 10.5" (1.486 m)   Wt 163 lb 9.6 oz (74.2 kg)   LMP 12/05/2021 (Exact Date)   Breastfeeding No   BMI 33.61 kg/m?   ?Appears well, in no apparent distress ? ?Results for orders placed or performed in visit on 03/01/22 (from the past 24 hour(s))  ?POCT urine pregnancy  ? Collection Time: 03/01/22 11:24 AM  ?Result Value Ref Range  ? Preg Test, Ur Positive (A) Negative  ? ? ?ASSESSMENT: ?Positive pregnancy test, [redacted]w[redacted]d by LMP   ? ?PLAN: ?Schedule for dating ultrasound in earlier available ?Prenatal vitamins: continue   ?Nausea medicines: requested-note routed to [facility] to send prescription   ?OB packet given: Yes ? ?Enna Warwick A Ido Wollman  ?03/01/2022 ?11:25 AM  ?

## 2022-03-08 ENCOUNTER — Other Ambulatory Visit: Payer: Self-pay | Admitting: Obstetrics & Gynecology

## 2022-03-08 DIAGNOSIS — Z3682 Encounter for antenatal screening for nuchal translucency: Secondary | ICD-10-CM

## 2022-03-10 ENCOUNTER — Ambulatory Visit (INDEPENDENT_AMBULATORY_CARE_PROVIDER_SITE_OTHER): Payer: Medicaid Other

## 2022-03-10 DIAGNOSIS — Z3A13 13 weeks gestation of pregnancy: Secondary | ICD-10-CM | POA: Diagnosis not present

## 2022-03-10 DIAGNOSIS — Z3682 Encounter for antenatal screening for nuchal translucency: Secondary | ICD-10-CM | POA: Diagnosis not present

## 2022-03-10 NOTE — Progress Notes (Signed)
[place] 13+4 wks,single IUP,posterior placenta,normal ovaries,NB present,CRL 73.60 mm,FHR 158 bpm,pt refused NT ?

## 2022-03-15 ENCOUNTER — Encounter: Payer: Self-pay | Admitting: Obstetrics & Gynecology

## 2022-03-15 DIAGNOSIS — Z3490 Encounter for supervision of normal pregnancy, unspecified, unspecified trimester: Secondary | ICD-10-CM | POA: Insufficient documentation

## 2022-03-15 DIAGNOSIS — Z8759 Personal history of other complications of pregnancy, childbirth and the puerperium: Secondary | ICD-10-CM | POA: Insufficient documentation

## 2022-03-15 DIAGNOSIS — Z6791 Unspecified blood type, Rh negative: Secondary | ICD-10-CM | POA: Insufficient documentation

## 2022-03-18 ENCOUNTER — Other Ambulatory Visit: Payer: Medicaid Other

## 2022-03-18 ENCOUNTER — Other Ambulatory Visit (HOSPITAL_COMMUNITY)
Admission: RE | Admit: 2022-03-18 | Discharge: 2022-03-18 | Disposition: A | Discharge status: Home / Self Care | Payer: Medicaid Other | Source: Ambulatory Visit | Attending: Obstetrics & Gynecology | Admitting: Obstetrics & Gynecology

## 2022-03-18 ENCOUNTER — Encounter: Payer: Self-pay | Admitting: Obstetrics & Gynecology

## 2022-03-18 ENCOUNTER — Ambulatory Visit (INDEPENDENT_AMBULATORY_CARE_PROVIDER_SITE_OTHER): Payer: Medicaid Other | Admitting: Obstetrics & Gynecology

## 2022-03-18 ENCOUNTER — Ambulatory Visit: Payer: Medicaid Other | Admitting: *Deleted

## 2022-03-18 VITALS — BP 108/67 | HR 74 | Wt 165.0 lb

## 2022-03-18 DIAGNOSIS — N898 Other specified noninflammatory disorders of vagina: Secondary | ICD-10-CM | POA: Insufficient documentation

## 2022-03-18 DIAGNOSIS — Z3A14 14 weeks gestation of pregnancy: Secondary | ICD-10-CM | POA: Insufficient documentation

## 2022-03-18 DIAGNOSIS — Z3480 Encounter for supervision of other normal pregnancy, unspecified trimester: Secondary | ICD-10-CM | POA: Diagnosis present

## 2022-03-18 DIAGNOSIS — Z113 Encounter for screening for infections with a predominantly sexual mode of transmission: Secondary | ICD-10-CM | POA: Diagnosis present

## 2022-03-18 DIAGNOSIS — Z833 Family history of diabetes mellitus: Secondary | ICD-10-CM

## 2022-03-18 DIAGNOSIS — O219 Vomiting of pregnancy, unspecified: Secondary | ICD-10-CM

## 2022-03-18 DIAGNOSIS — O26899 Other specified pregnancy related conditions, unspecified trimester: Secondary | ICD-10-CM

## 2022-03-18 DIAGNOSIS — Z6791 Unspecified blood type, Rh negative: Secondary | ICD-10-CM

## 2022-03-18 DIAGNOSIS — Z8759 Personal history of other complications of pregnancy, childbirth and the puerperium: Secondary | ICD-10-CM

## 2022-03-18 LAB — POCT URINALYSIS DIPSTICK OB
Blood, UA: NEGATIVE
Glucose, UA: NEGATIVE
Ketones, UA: NEGATIVE
Leukocytes, UA: NEGATIVE
Nitrite, UA: NEGATIVE
POC,PROTEIN,UA: NEGATIVE

## 2022-03-18 MED ORDER — ONDANSETRON 4 MG PO TBDP: 4.0000 mg | ORAL_TABLET | Freq: Three times a day (TID) | ORAL | 0 refills | Status: DC | PRN

## 2022-03-18 MED ORDER — ASPIRIN EC 81 MG PO TBEC: 162.0000 mg | DELAYED_RELEASE_TABLET | Freq: Every day | ORAL | 3 refills | Status: AC

## 2022-03-18 NOTE — Progress Notes (Signed)
? ?INITIAL OBSTETRICAL VISIT ?Patient name: Kelsey Lambert MRN [id number]  Date of birth: [birth year] ?Chief Complaint:   ?Initial Prenatal Visit (Still having nausea) ? ?History of Present Illness:   ?Kelsey Lambert is a 26 y.o. G58P1001 female at [redacted]w[redacted]d by LMP c/w u/s at 13 weeks with an Estimated Date of Delivery: 09/11/22 being seen today for her initial obstetrical visit.   ? ?Her obstetrical history is significant for ?-pre-eclampsia- IOL @ 38wk for gHTN ?-prior VAVD due to NRFHT and shoulder dystocia ?-Rh neg ? ?Current concerns: ?1) Nausea ?No vomiting, ?No improvement with phenergan  ?2) Vaginal itching- comes and goes, she has not tried any OTC treatment ? ? ?  03/18/2022  ?  9:57 AM 02/10/2020  ?  9:23 AM 01/10/2020  ? 10:00 AM 01/10/2020  ?  9:47 AM  ?Depression screen PHQ 2/9  ?Decreased Interest 0 1 2 3   ?Down, Depressed, Hopeless 0 0 0 0  ?PHQ - 2 Score 0 1 2 3   ?Altered sleeping 1 2 2 1   ?Tired, decreased energy 1 2 1  0  ?Change in appetite 1 2 2 1   ?Feeling bad or failure about yourself  0 0 0 0  ?Trouble concentrating 0 2 0 0  ?Moving slowly or fidgety/restless 0 0 0 0  ?Suicidal thoughts 0 0 0 0  ?PHQ-9 Score 3 9 7 5   ?Difficult doing work/chores    Not difficult at all  ? ? ?Patient's last menstrual period was 12/05/2021 (exact date). ?Last pap 01/2020. Results were: NILM w/ HRHPV negative ?Review of Systems:   ?Pertinent items are noted in HPI ?Denies cramping/contractions, leakage of fluid, vaginal bleeding, headaches, visual changes, shortness of breath, chest pain, abdominal pain, severe nausea/vomiting, or problems with urination or bowel movements unless otherwise stated above.  ?Pertinent History Reviewed:  ?Reviewed past medical,surgical, social, obstetrical and family history.  ?Reviewed problem list, medications and allergies. ?OB History  ?Gravida Para Term Preterm AB Living  ?2 1 1     1   ?SAB IAB Ectopic Multiple Live Births  ?      0 1  ?  ?# Outcome Date GA Lbr Len/2nd Weight Sex Delivery  Anes PTL Lv  ?2 Current           ?1 Term 04/28/20 [redacted]w[redacted]d 21:08 / 00:36 7 lb 3.7 oz (3.28 kg) F Vag-Vacuum EPI N LIV  ?   Birth Comments: WNL  ?   Complications: Severe pre-eclampsia  ? ?Physical Assessment:  ? ?Vitals:  ? 03/18/22 0942  ?BP: 108/67  ?Pulse: 74  ?Weight: 165 lb (74.8 kg)  ?Body mass index is 33.9 kg/m?. ? ?     Physical Examination: ? General appearance - well appearing, and in no distress ? Mental status - alert, oriented to person, place, and time ? Psych:  She has a normal mood and affect ? Skin - warm and dry, normal color, no suspicious lesions noted ? Chest - effort normal, all lung fields clear to auscultation bilaterally ? Heart - normal rate and regular rhythm ? Abdomen - soft, nontender ? Extremities:  No swelling or varicosities noted ? Pelvic - VULVA: normal appearing vulva with no masses, tenderness or lesions  VAGINA: normal appearing vagina with normal color and discharge, no lesions  CERVIX: normal appearing cervix without discharge or lesions, no CMT ?  ?Chaperone: Levy Pupa   ? ?TODAY'S Ultrasound - [place] 13+4 wks,single IUP,posterior placenta,normal ovaries,NB present,CRL 73.60 mm,FHR 158 bpm,pt refused NT ? ?Results  for orders placed or performed in visit on 03/18/22 (from the past 24 hour(s))  ?POC Urinalysis Dipstick OB  ? Collection Time: 03/18/22 10:05 AM  ?Result Value Ref Range  ? Color, UA    ? Clarity, UA    ? Glucose, UA Negative Negative  ? Bilirubin, UA    ? Ketones, UA neg   ? Spec Grav, UA    ? Blood, UA neg   ? pH, UA    ? POC,PROTEIN,UA Negative Negative, Trace, Small (1+), Moderate (2+), Large (3+), 4+  ? Urobilinogen, UA    ? Nitrite, UA neg   ? Leukocytes, UA Negative Negative  ? Appearance    ? Odor    ?  ?Assessment & Plan:  ?1) Low-Risk Pregnancy G2P1001 at [redacted]w[redacted]d with an Estimated Date of Delivery: 09/11/22  ? ?2) Initial OB visit ? ?3) Nausea- Rx for zofran sent in ? ?4) Due to h/o preE- plan to start ASA 162mg  daily ?Baseline lab work today ? ?Meds:  ?Meds  ordered this encounter  ?Medications  ? ondansetron (ZOFRAN-ODT) 4 MG disintegrating tablet  ?  Sig: Take 1 tablet (4 mg total) by mouth every 8 (eight) hours as needed for nausea or vomiting.  ?  Dispense:  20 tablet  ?  Refill:  0  ? aspirin EC 81 MG tablet  ?  Sig: Take 2 tablets (162 mg total) by mouth daily. Swallow whole.  ?  Dispense:  180 tablet  ?  Refill:  3  ? ? ?Initial labs obtained ?Continue prenatal vitamins ?Reviewed n/v relief measures and warning s/s to report ?Reviewed recommended weight gain based on pre-gravid BMI ?Encouraged well-balanced diet ?Genetic & carrier screening discussed: declines Panorama, NT/IT, and AFP, declines Horizon  ?Ultrasound discussed; fetal survey: results reviewed ?CCNC completed> form faxed if has or is planning to apply for medicaid ?The nature of [place] for [facility] with multiple MDs and other Advanced Practice Providers was explained to patient; also emphasized that fellows, residents, and students are part of our team. ?Pt has home bp cuff. Check bp weekly, let us know if >140/90.  ? ?Indications for ASA therapy (per uptodate) ?One of the following: ?H/O preeclampsia, especially early onset/adverse outcome Yes ?Multifetal gestation No ?CHTN No ?T1DM or T2DM No ?Chronic kidney disease No ?Autoimmune disease (antiphospholipid syndrome, systemic lupus erythematosus) No ? ? ?Indications for early A1C (per uptodate) ?BMI >=25 (>=23 in Asian women) AND one of the following ?GDM in a previous pregnancy No ?Previous A1C?5.7, impaired glucose tolerance, or impaired fasting glucose on previous testing No ?First-degree relative with diabetes Yes ?High-risk race/ethnicity (eg, African American, Latino, Native American, [place] American, [place] Islander) No ?History of cardiovascular disease No ?HTN or on therapy for hypertension No ?HDL cholesterol level <35 mg/dL (0.90 mmol/L) and/or a triglyceride level >250 mg/dL (2.82 mmol/L) No ?PCOS No ?Physical  inactivity Yes ?Other clinical condition associated with insulin resistance (eg, severe obesity, acanthosis nigricans) No ?Previous birth of an infant weighing ?4000 g No ?Previous stillbirth of unknown cause No ?>= 40yo No ? ?Follow-up: Return in about 4 weeks (around 04/15/2022) for [place] visit (ok for CNM).  ? ?Orders Placed This Encounter  ?Procedures  ? Urine Culture  ? CBC/D/Plt+RPR+Rh+ABO+RubIgG...  ? Protein / creatinine ratio, urine  ? Comprehensive metabolic panel  ? HgB A1c  ? POC Urinalysis Dipstick OB  ? ?Janyth Pupa, DO ?Attending [place] Faculty Practice ?[facility] ? ? ?

## 2022-03-19 LAB — HEMOGLOBIN A1C
Est. average glucose Bld gHb Est-mCnc: 103 mg/dL
Hgb A1c MFr Bld: 5.2 % (ref 4.8–5.6)

## 2022-03-20 LAB — COMPREHENSIVE METABOLIC PANEL
ALT: 6 IU/L (ref 0–32)
AST: 13 IU/L (ref 0–40)
Albumin/Globulin Ratio: 1.7 (ref 1.2–2.2)
Albumin: 4.1 g/dL (ref 3.9–5.0)
Alkaline Phosphatase: 64 IU/L (ref 44–121)
BUN/Creatinine Ratio: 19 (ref 9–23)
BUN: 8 mg/dL (ref 6–20)
Bilirubin Total: 0.2 mg/dL (ref 0.0–1.2)
CO2: 19 mmol/L — ABNORMAL LOW (ref 20–29)
Calcium: 9.2 mg/dL (ref 8.7–10.2)
Chloride: 102 mmol/L (ref 96–106)
Creatinine, Ser: 0.43 mg/dL — ABNORMAL LOW (ref 0.57–1.00)
Globulin, Total: 2.4 g/dL (ref 1.5–4.5)
Glucose: 77 mg/dL (ref 70–99)
Potassium: 4 mmol/L (ref 3.5–5.2)
Sodium: 136 mmol/L (ref 134–144)
Total Protein: 6.5 g/dL (ref 6.0–8.5)
eGFR: 137 mL/min/{1.73_m2} (ref >59)

## 2022-03-20 LAB — CBC/D/PLT+RPR+RH+ABO+RUBIGG...
Antibody Screen: NEGATIVE
Basophils Absolute: 0 10*3/uL (ref 0.0–0.2)
Basos: 0 %
EOS (ABSOLUTE): 0 10*3/uL (ref 0.0–0.4)
Eos: 1 %
HCV Ab: NONREACTIVE
HIV Screen 4th Generation wRfx: NONREACTIVE
Hematocrit: 37.7 % (ref 34.0–46.6)
Hemoglobin: 12.6 g/dL (ref 11.1–15.9)
Hepatitis B Surface Ag: NEGATIVE
Immature Grans (Abs): 0 10*3/uL (ref 0.0–0.1)
Immature Granulocytes: 0 %
Lymphocytes Absolute: 2.1 10*3/uL (ref 0.7–3.1)
Lymphs: 24 %
MCH: 28.1 pg (ref 26.6–33.0)
MCHC: 33.4 g/dL (ref 31.5–35.7)
MCV: 84 fL (ref 79–97)
Monocytes Absolute: 0.4 10*3/uL (ref 0.1–0.9)
Monocytes: 5 %
Neutrophils Absolute: 6.2 10*3/uL (ref 1.4–7.0)
Neutrophils: 70 %
Platelets: 220 10*3/uL (ref 150–450)
RBC: 4.48 x10E6/uL (ref 3.77–5.28)
RDW: 15.1 % (ref 11.7–15.4)
RPR Ser Ql: NONREACTIVE
Rh Factor: NEGATIVE
Rubella Antibodies, IGG: 1.08 index (ref >0.99)
WBC: 8.8 10*3/uL (ref 3.4–10.8)

## 2022-03-20 LAB — PROTEIN / CREATININE RATIO, URINE
Creatinine, Urine: 164.7 mg/dL
Protein, Ur: 13.6 mg/dL
Protein/Creat Ratio: 83 mg/g creat (ref 0–200)

## 2022-03-20 LAB — HCV INTERPRETATION

## 2022-03-21 ENCOUNTER — Other Ambulatory Visit: Payer: Self-pay | Admitting: Obstetrics & Gynecology

## 2022-03-21 DIAGNOSIS — B379 Candidiasis, unspecified: Secondary | ICD-10-CM

## 2022-03-21 LAB — CERVICOVAGINAL ANCILLARY ONLY
Bacterial Vaginitis (gardnerella): NEGATIVE
Candida Glabrata: NEGATIVE
Candida Vaginitis: POSITIVE — AB
Chlamydia: NEGATIVE
Comment: NEGATIVE
Comment: NEGATIVE
Comment: NEGATIVE
Comment: NEGATIVE
Comment: NEGATIVE
Comment: NORMAL
Neisseria Gonorrhea: NEGATIVE
Trichomonas: NEGATIVE

## 2022-03-21 MED ORDER — TERCONAZOLE 0.4 % VA CREA: 1.0000 | TOPICAL_CREAM | Freq: Every day | VAGINAL | 1 refills | Status: AC

## 2022-03-21 NOTE — Progress Notes (Signed)
Rx for yeast infection senin ?

## 2022-03-22 LAB — URINE CULTURE

## 2022-04-15 ENCOUNTER — Ambulatory Visit (INDEPENDENT_AMBULATORY_CARE_PROVIDER_SITE_OTHER): Payer: Medicaid Other | Admitting: Advanced Practice Midwife

## 2022-04-15 VITALS — BP 115/80 | HR 84 | Wt 166.0 lb

## 2022-04-15 DIAGNOSIS — Z3A18 18 weeks gestation of pregnancy: Secondary | ICD-10-CM

## 2022-04-15 DIAGNOSIS — Z3480 Encounter for supervision of other normal pregnancy, unspecified trimester: Secondary | ICD-10-CM

## 2022-04-15 DIAGNOSIS — Z8759 Personal history of other complications of pregnancy, childbirth and the puerperium: Secondary | ICD-10-CM

## 2022-04-15 DIAGNOSIS — Z363 Encounter for antenatal screening for malformations: Secondary | ICD-10-CM

## 2022-04-15 DIAGNOSIS — Z302 Encounter for sterilization: Secondary | ICD-10-CM | POA: Insufficient documentation

## 2022-04-15 NOTE — Progress Notes (Signed)
   LOW-RISK PREGNANCY VISIT Patient name: Kelsey Lambert MRN [id number]  Date of birth: [birth year] Chief Complaint:   Routine Prenatal Visit  History of Present Illness:   Kelsey Lambert is a 26 y.o. G64P1001 female at [redacted]w[redacted]d with an Estimated Date of Delivery: 09/11/22 being seen today for ongoing management of a low-risk pregnancy.  Today she reports nausea. Contractions: Not present. Vag. Bleeding: None.  Movement: Present. denies leaking of fluid. Review of Systems:   Pertinent items are noted in HPI Denies abnormal vaginal discharge w/ itching/odor/irritation, headaches, visual changes, shortness of breath, chest pain, abdominal pain, severe nausea/vomiting, or problems with urination or bowel movements unless otherwise stated above. Pertinent History Reviewed:  Reviewed past medical,surgical, social, obstetrical and family history.  Reviewed problem list, medications and allergies. Physical Assessment:   Vitals:   04/15/22 1122  BP: 115/80  Pulse: 84  Weight: 166 lb (75.3 kg)  Body mass index is 34.1 kg/m.        Physical Examination:   General appearance: Well appearing, and in no distress  Mental status: Alert, oriented to person, place, and time  Skin: Warm & dry  Cardiovascular: Normal heart rate noted  Respiratory: Normal respiratory effort, no distress  Abdomen: Soft, gravid, nontender  Pelvic: Cervical exam deferred         Extremities: Edema: None  Fetal Status: Fetal Heart Rate (bpm): 164   Movement: Present    No results found for this or any previous visit (from the past 24 hour(s)).  Assessment & Plan:  1) Low-risk pregnancy G2P1001 at [redacted]w[redacted]d with an Estimated Date of Delivery: 09/11/22   2) Rh neg, plan for Rhogam ~28wks  3) Wants ppBTL, reviewed LARCs with the same effectiveness; wants to sign papers ~28wks  4) Hx pre-e, taking bASA, neg baseline labs, nl BPs   Meds: No orders of the defined types were placed in this encounter.  Labs/procedures today:  none  Plan:  Continue routine obstetrical care   Reviewed: Preterm labor symptoms and general obstetric precautions including but not limited to vaginal bleeding, contractions, leaking of fluid and fetal movement were reviewed in detail with the patient.  All questions were answered. Has home bp cuff.  Check bp weekly, let us know if >140/90.   Follow-up: Return in about 2 weeks (around 04/29/2022) for LROB, [place]: Anatomy, in person.  Orders Placed This Encounter  Procedures   US OB Comp + 14 Wk   Arabella Merles [facility] 04/15/2022 11:43 AM

## 2022-04-15 NOTE — Patient Instructions (Signed)
Amayrani, thank you for choosing our office today! We appreciate the opportunity to meet your healthcare needs. You may receive a short survey by mail, e-mail, or through MyChart. If you are happy with your care we would appreciate if you could take just a few minutes to complete the survey questions. We read all of your comments and take your feedback very seriously. Thank you again for choosing our office.  [facility] Team at [facility] ([address] [place], [place] [address]) Entrance C, located off of [address] Free 24/7 valet parking  Go to [email or web address] to register for FREE online childbirth classes  Call the office ([phone]) or go to [facility] if: You begin to severe cramping Your water breaks.  Sometimes it is a big gush of fluid, sometimes it is just a trickle that keeps getting your panties wet or running down your legs You have vaginal bleeding.  It is normal to have a small amount of spotting if your cervix was checked.   [place] Pediatricians/Family Doctors [facility] ([facility]): [address], [phone]           [facility]: [address], [phone]                [facility] ([facility]): [address], [phone] (call to ask if accepting patients) [facility]: [address], [place], [phone]    [place] Pediatricians/Family Doctors [facility] ([facility]): [address], [phone] [facility]: [address], [phone] [facility]: [address], [phone]  [place] [facility]  [facility] ([facility]): [phone] [facility]: [address], [phone]   [place] Family Doctors [facility]: [address], [phone]  [place] [facility]  [facility]: [address], [phone]  Home Blood Pressure Monitoring for Patients   Your provider has recommended that you check your blood pressure (BP) at least once a week at home. If you do not have a blood pressure cuff at home, one will be provided for you. Contact your provider if you have not received your monitor within 1 week.   Helpful Tips for Accurate Home Blood Pressure Checks  Don't smoke, exercise, or drink caffeine 30 minutes before checking your BP Use the restroom before checking your BP (a full bladder can raise your pressure) Relax in a comfortable upright chair Feet on the ground Left arm resting comfortably on a flat surface at the level of your heart Legs uncrossed Back supported Sit quietly and don't talk Place the cuff on your bare arm Adjust snuggly, so that only two fingertips can fit between your skin and the top of the cuff Check 2 readings separated by at least one minute Keep a log of your BP readings For a visual, please reference this diagram: http://ccnc.care/bpdiagram  Provider Name: [facility]     Phone: [phone]  Zone 1: ALL CLEAR  Continue to monitor your symptoms:  BP reading is less than 140 (top number) or less than 90 (bottom number)  No right upper stomach pain No headaches or seeing spots No feeling nauseated or throwing up No swelling in face and hands  Zone 2: CAUTION Call your doctor's office for any of the following:  BP reading is greater than 140 (top number) or greater than   90 (bottom number)  Stomach pain under your ribs in the middle or right side Headaches or seeing spots Feeling nauseated or throwing up Swelling in face and hands  Zone 3: EMERGENCY  Seek immediate medical care if you have any of the following:  BP reading is greater than160 (top number) or greater than 110 (bottom number) Severe headaches not improving with Tylenol Serious difficulty catching your breath Any worsening symptoms from Zone 2      Second Trimester of Pregnancy The second trimester is from week 14 through week 27 (months 4 through 6). The second trimester is often a time when you feel your best. Your body has adjusted to being pregnant, and you begin to feel better physically. Usually, morning sickness has lessened or quit completely, you may have more energy, and you may have an increase in appetite. The second trimester is also a time when the fetus is growing rapidly. At the end of the sixth month, the fetus is about 9 inches long and weighs about 1 pounds. You will likely begin to feel the baby move (quickening) between 16 and 20 weeks of pregnancy. Body changes during your second trimester Your body continues to go through many changes during your second trimester. The changes vary from woman to woman. Your weight will continue to increase. You will notice your lower abdomen bulging out. You may begin to get stretch marks on your hips, abdomen, and breasts. You may develop headaches that can be relieved by medicines. The medicines should be approved by your health care provider. You may urinate more often because the fetus is pressing on your bladder. You may develop or continue to have heartburn as a result of your pregnancy. You may develop constipation because certain hormones are causing the muscles that push waste through your intestines to slow down. You may develop hemorrhoids or swollen, bulging veins (varicose veins). You may have back pain. This is caused by: Weight gain. Pregnancy hormones that are relaxing the joints in your pelvis. A shift in weight and the muscles that support your balance. Your breasts will continue to grow and they will continue to become tender. Your gums may bleed and may be sensitive to brushing and flossing. Dark spots or blotches (chloasma, mask of pregnancy) may develop on your face. This will likely fade after the baby is born. A dark line from your belly button to the  pubic area (linea nigra) may appear. This will likely fade after the baby is born. You may have changes in your hair. These can include thickening of your hair, rapid growth, and changes in texture. Some women also have hair loss during or after pregnancy, or hair that feels dry or thin. Your hair will most likely return to normal after your baby is born.  What to expect at prenatal visits During a routine prenatal visit: You will be weighed to make sure you and the fetus are growing normally. Your blood pressure will be taken. Your abdomen will be measured to track your baby's growth. The fetal heartbeat will be listened to. Any test results from the previous visit will be discussed.  Your health care provider may ask you: How you are feeling. If you are feeling the baby move. If you have had any abnormal symptoms, such as leaking fluid, bleeding, severe headaches, or abdominal cramping. If you are using any tobacco products, including cigarettes, chewing tobacco, and electronic cigarettes. If you have any questions.  Other tests that may be performed during  your second trimester include: Blood tests that check for: Low iron levels (anemia). High blood sugar that affects pregnant women (gestational diabetes) between 72 and 28 weeks. Rh antibodies. This is to check for a protein on red blood cells (Rh factor). Urine tests to check for infections, diabetes, or protein in the urine. An ultrasound to confirm the proper growth and development of the baby. An amniocentesis to check for possible genetic problems. Fetal screens for spina bifida and Down syndrome. HIV (human immunodeficiency virus) testing. Routine prenatal testing includes screening for HIV, unless you choose not to have this test.  Follow these instructions at home: Medicines Follow your health care provider's instructions regarding medicine use. Specific medicines may be either safe or unsafe to take during pregnancy. Take  a prenatal vitamin that contains at least 600 micrograms (mcg) of folic acid. If you develop constipation, try taking a stool softener if your health care provider approves. Eating and drinking Eat a balanced diet that includes fresh fruits and vegetables, whole grains, good sources of protein such as meat, eggs, or tofu, and low-fat dairy. Your health care provider will help you determine the amount of weight gain that is right for you. Avoid raw meat and uncooked cheese. These carry germs that can cause birth defects in the baby. If you have low calcium intake from food, talk to your health care provider about whether you should take a daily calcium supplement. Limit foods that are high in fat and processed sugars, such as fried and sweet foods. To prevent constipation: Drink enough fluid to keep your urine clear or pale yellow. Eat foods that are high in fiber, such as fresh fruits and vegetables, whole grains, and beans. Activity Exercise only as directed by your health care provider. Most women can continue their usual exercise routine during pregnancy. Try to exercise for 30 minutes at least 5 days a week. Stop exercising if you experience uterine contractions. Avoid heavy lifting, wear low heel shoes, and practice good posture. A sexual relationship may be continued unless your health care provider directs you otherwise. Relieving pain and discomfort Wear a good support bra to prevent discomfort from breast tenderness. Take warm sitz baths to soothe any pain or discomfort caused by hemorrhoids. Use hemorrhoid cream if your health care provider approves. Rest with your legs elevated if you have leg cramps or low back pain. If you develop varicose veins, wear support hose. Elevate your feet for 15 minutes, 3-4 times a day. Limit salt in your diet. Prenatal Care Write down your questions. Take them to your prenatal visits. Keep all your prenatal visits as told by your health care provider.  This is important. Safety Wear your seat belt at all times when driving. Make a list of emergency phone numbers, including numbers for family, friends, the hospital, and police and fire departments. General instructions Ask your health care provider for a referral to a local prenatal education class. Begin classes no later than the beginning of month 6 of your pregnancy. Ask for help if you have counseling or nutritional needs during pregnancy. Your health care provider can offer advice or refer you to specialists for help with various needs. Do not use hot tubs, steam rooms, or saunas. Do not douche or use tampons or scented sanitary pads. Do not cross your legs for long periods of time. Avoid cat litter boxes and soil used by cats. These carry germs that can cause birth defects in the baby and possibly loss of the  fetus by miscarriage or stillbirth. Avoid all smoking, herbs, alcohol, and unprescribed drugs. Chemicals in these products can affect the formation and growth of the baby. Do not use any products that contain nicotine or tobacco, such as cigarettes and e-cigarettes. If you need help quitting, ask your health care provider. Visit your dentist if you have not gone yet during your pregnancy. Use a soft toothbrush to brush your teeth and be gentle when you floss. Contact a health care provider if: You have dizziness. You have mild pelvic cramps, pelvic pressure, or nagging pain in the abdominal area. You have persistent nausea, vomiting, or diarrhea. You have a bad smelling vaginal discharge. You have pain when you urinate. Get help right away if: You have a fever. You are leaking fluid from your vagina. You have spotting or bleeding from your vagina. You have severe abdominal cramping or pain. You have rapid weight gain or weight loss. You have shortness of breath with chest pain. You notice sudden or extreme swelling of your face, hands, ankles, feet, or legs. You have not felt  your baby move in over an hour. You have severe headaches that do not go away when you take medicine. You have vision changes. Summary The second trimester is from week 14 through week 27 (months 4 through 6). It is also a time when the fetus is growing rapidly. Your body goes through many changes during pregnancy. The changes vary from woman to woman. Avoid all smoking, herbs, alcohol, and unprescribed drugs. These chemicals affect the formation and growth your baby. Do not use any tobacco products, such as cigarettes, chewing tobacco, and e-cigarettes. If you need help quitting, ask your health care provider. Contact your health care provider if you have any questions. Keep all prenatal visits as told by your health care provider. This is important. This information is not intended to replace advice given to you by your health care provider. Make sure you discuss any questions you have with your health care provider. Document Released: 10/18/2001 Document Revised: 03/31/2016 Document Reviewed: 12/25/2012 Elsevier Interactive Patient Education  2017 [employer].

## 2022-05-04 ENCOUNTER — Ambulatory Visit (INDEPENDENT_AMBULATORY_CARE_PROVIDER_SITE_OTHER): Payer: Medicaid Other

## 2022-05-04 ENCOUNTER — Ambulatory Visit (INDEPENDENT_AMBULATORY_CARE_PROVIDER_SITE_OTHER): Payer: Medicaid Other | Admitting: Advanced Practice Midwife

## 2022-05-04 ENCOUNTER — Encounter: Payer: Self-pay | Admitting: Advanced Practice Midwife

## 2022-05-04 VITALS — BP 122/80 | HR 86 | Wt 168.0 lb

## 2022-05-04 DIAGNOSIS — Z302 Encounter for sterilization: Secondary | ICD-10-CM

## 2022-05-04 DIAGNOSIS — Z6791 Unspecified blood type, Rh negative: Secondary | ICD-10-CM

## 2022-05-04 DIAGNOSIS — Z3A21 21 weeks gestation of pregnancy: Secondary | ICD-10-CM

## 2022-05-04 DIAGNOSIS — Z3480 Encounter for supervision of other normal pregnancy, unspecified trimester: Secondary | ICD-10-CM

## 2022-05-04 DIAGNOSIS — Z363 Encounter for antenatal screening for malformations: Secondary | ICD-10-CM | POA: Diagnosis not present

## 2022-05-04 NOTE — Progress Notes (Signed)
[place] 21+3 wks,breech,posterior placenta gr 0,normal ovaries,cx 5.5 cm,svp of fluid 5 cm,EFW 464 g 71%,fhr 135 bpm,anatomy complete,no obvious abnormalities

## 2022-05-04 NOTE — Progress Notes (Signed)
   LOW-RISK PREGNANCY VISIT Patient name: Kelsey Lambert MRN [id number]  Date of birth: Jan 29, 1996 Chief Complaint:   Routine Prenatal Visit  History of Present Illness:   Kelsey Lambert is a 26 y.o. G2P1001 female at [redacted]w[redacted]d with an Estimated Date of Delivery: 09/11/22 being seen today for ongoing management of a low-risk pregnancy.  Today she reports no complaints. Contractions: Not present. Vag. Bleeding: None.  Movement: Present. denies leaking of fluid. Review of Systems:   Pertinent items are noted in HPI Denies abnormal vaginal discharge w/ itching/odor/irritation, headaches, visual changes, shortness of breath, chest pain, abdominal pain, severe nausea/vomiting, or problems with urination or bowel movements unless otherwise stated above. Pertinent History Reviewed:  Reviewed past medical,surgical, social, obstetrical and family history.  Reviewed problem list, medications and allergies. Physical Assessment:   Vitals:   05/04/22 1002  BP: 122/80  Pulse: 86  Weight: 168 lb (76.2 kg)  Body mass index is 34.51 kg/m.        Physical Examination:   General appearance: Well appearing, and in no distress  Mental status: Alert, oriented to person, place, and time  Skin: Warm & dry  Cardiovascular: Normal heart rate noted  Respiratory: Normal respiratory effort, no distress  Abdomen: Soft, gravid, nontender  Pelvic: Cervical exam deferred         Extremities: Edema: None  Fetal Status: Fetal Heart Rate (bpm): 135 u/s   Movement: Present    Anatomy u/s: [place] 21+3 wks,breech,posterior placenta gr 0,normal ovaries,cx 5.5 cm,svp of fluid 5 cm,EFW 464 g 71%,fhr 135 bpm,anatomy complete,no obvious abnormalities   No results found for this or any previous visit (from the past 24 hour(s)).  Assessment & Plan:  1) Low-risk pregnancy G2P1001 at [redacted]w[redacted]d with an Estimated Date of Delivery: 09/11/22   2) Rh neg, Rhogam ~ 28wks  3) Hx gHTN/severe pre-e, taking bASA   Meds: No orders of the  defined types were placed in this encounter.  Labs/procedures today: anatomy u/s  Plan:  Continue routine obstetrical care   Reviewed: Preterm labor symptoms and general obstetric precautions including but not limited to vaginal bleeding, contractions, leaking of fluid and fetal movement were reviewed in detail with the patient.  All questions were answered. Has home bp cuff.  Check bp weekly, let us know if >140/90.   Follow-up: Return in about 4 weeks (around 06/01/2022) for LROB, in person.  No orders of the defined types were placed in this encounter.  Arabella Merles CNM 05/04/2022 10:31 AM

## 2022-05-04 NOTE — Patient Instructions (Signed)
Kelsey Lambert, thank you for choosing our office today! We appreciate the opportunity to meet your healthcare needs. You may receive a short survey by mail, e-mail, or through [employer]. If you are happy with your care we would appreciate if you could take just a few minutes to complete the survey questions. We read all of your comments and take your feedback very seriously. Thank you again for choosing our office.  Center for [employer] Team at [facility] at [facility] ([address] [place], [place] [address]) Entrance C, located off of [address] Free 24/7 valet parking  Go to [email or web address] to register for FREE online childbirth classes  Call the office [phone]) or go to [facility] if: You begin to severe cramping Your water breaks.  Sometimes it is a big gush of fluid, sometimes it is just a trickle that keeps getting your panties wet or running down your legs You have vaginal bleeding.  It is normal to have a small amount of spotting if your cervix was checked.   [facility] Pediatricians/Family Doctors [facility]): [address] Colette Ribas, [phone]           [facility]: [address], [phone]                [facility]): [address], [phone] (call to ask if accepting patients) [facility] Department: [address], [place], [phone]    [facility] Pediatricians/Family Doctors [facility]): [phone] [address], [phone] [facility]: [address] [place], [phone] [facility] [place]: [address], [phone]  [facility] Doctors  [facility]): [phone] [facility]: [address] [phone]   [facility] Main Doctors [facility]: [address] [phone]  [facility] Doctors  [facility]: [phone], [phone]  Home Blood Pressure Monitoring for Patients   Your provider has recommended that you check your blood pressure (BP) at least once a week at home. If you do not have a blood pressure cuff at home, one will be provided for you. Contact your provider if you have not received your monitor within 1 week.   Helpful Tips for Accurate Home Blood Pressure Checks  Don't smoke, exercise, or drink caffeine 30 minutes before checking your BP Use the restroom before checking your BP (a full bladder can raise your pressure) Relax in a comfortable upright chair Feet on the ground Left arm resting comfortably on a flat surface at the level of your heart Legs uncrossed Back supported Sit quietly and don't talk Place the cuff on your bare arm Adjust snuggly, so that only two fingertips can fit between your skin and the top of the cuff Check 2 readings separated by at least one minute Keep a log of your BP readings For a visual, please reference this diagram: http://ccnc.care/bpdiagram  Provider Name: [facility]     Phone: [phone]  Zone 1: ALL CLEAR  Continue to monitor your symptoms:  BP reading is less than 140 (top number) or less than 90 (bottom number)  No right upper stomach pain No headaches or seeing spots No feeling nauseated or throwing up No swelling in face and hands  Zone 2: CAUTION Call your doctor's office for any of the following:  BP reading is greater than 140 (top number) or greater than  90 (bottom number)  Stomach pain under your ribs in the middle or right side Headaches or seeing spots Feeling nauseated or throwing up Swelling in face and hands  Zone 3: EMERGENCY  Seek immediate medical care if you have any of the following:  BP reading is greater than160 (top number) or greater than 110 (bottom number) Severe headaches not improving with Tylenol Serious difficulty catching your breath Any worsening symptoms from Zone 2      Second Trimester of Pregnancy The second trimester is from week 14 through week 27 (months 4 through 6). The second trimester is often a time when you feel your best. Your body has adjusted to being pregnant, and you begin to feel better physically. Usually, morning sickness has lessened or quit completely, you may have more energy, and you may have an increase in appetite. The second trimester is also a time when the fetus is growing rapidly. At the end of the sixth month, the fetus is about 9 inches long and weighs about 1 pounds. You will likely begin to feel the baby move (quickening) between 16 and 20 weeks of pregnancy. Body changes during your second trimester Your body continues to go through many changes during your second trimester. The changes vary from woman to woman. Your weight will continue to increase. You will notice your lower abdomen bulging out. You may begin to get stretch marks on your hips, abdomen, and breasts. You may develop headaches that can be relieved by medicines. The medicines should be approved by your health care provider. You may urinate more often because the fetus is pressing on your bladder. You may develop or continue to have heartburn as a result of your pregnancy. You may develop constipation because certain hormones are causing the muscles that push waste through your intestines to slow down. You may develop hemorrhoids or swollen, bulging veins (varicose veins). You may have back pain. This is caused by: Weight gain. Pregnancy hormones that are relaxing the joints in your pelvis. A shift in weight and the muscles that support your balance. Your breasts will continue to grow and they will continue to become tender. Your gums may bleed and may be sensitive to brushing and flossing. Dark spots or blotches (chloasma, mask of pregnancy) may develop on your face. This will likely fade after the baby is born. A dark line from your belly button to the  pubic area (linea nigra) may appear. This will likely fade after the baby is born. You may have changes in your hair. These can include thickening of your hair, rapid growth, and changes in texture. Some women also have hair loss during or after pregnancy, or hair that feels dry or thin. Your hair will most likely return to normal after your baby is born.  What to expect at prenatal visits During a routine prenatal visit: You will be weighed to make sure you and the fetus are growing normally. Your blood pressure will be taken. Your abdomen will be measured to track your baby's growth. The fetal heartbeat will be listened to. Any test results from the previous visit will be discussed.  Your health care provider may ask you: How you are feeling. If you are feeling the baby move. If you have had any abnormal symptoms, such as leaking fluid, bleeding, severe headaches, or abdominal cramping. If you are using any tobacco products, including cigarettes, chewing tobacco, and electronic cigarettes. If you have any questions.  Other tests that may be performed during  your second trimester include: Blood tests that check for: Low iron levels (anemia). High blood sugar that affects pregnant women (gestational diabetes) between 72 and 28 weeks. Rh antibodies. This is to check for a protein on red blood cells (Rh factor). Urine tests to check for infections, diabetes, or protein in the urine. An ultrasound to confirm the proper growth and development of the baby. An amniocentesis to check for possible genetic problems. Fetal screens for spina bifida and Down syndrome. HIV (human immunodeficiency virus) testing. Routine prenatal testing includes screening for HIV, unless you choose not to have this test.  Follow these instructions at home: Medicines Follow your health care provider's instructions regarding medicine use. Specific medicines may be either safe or unsafe to take during pregnancy. Take  a prenatal vitamin that contains at least 600 micrograms (mcg) of folic acid. If you develop constipation, try taking a stool softener if your health care provider approves. Eating and drinking Eat a balanced diet that includes fresh fruits and vegetables, whole grains, good sources of protein such as meat, eggs, or tofu, and low-fat dairy. Your health care provider will help you determine the amount of weight gain that is right for you. Avoid raw meat and uncooked cheese. These carry germs that can cause birth defects in the baby. If you have low calcium intake from food, talk to your health care provider about whether you should take a daily calcium supplement. Limit foods that are high in fat and processed sugars, such as fried and sweet foods. To prevent constipation: Drink enough fluid to keep your urine clear or pale yellow. Eat foods that are high in fiber, such as fresh fruits and vegetables, whole grains, and beans. Activity Exercise only as directed by your health care provider. Most women can continue their usual exercise routine during pregnancy. Try to exercise for 30 minutes at least 5 days a week. Stop exercising if you experience uterine contractions. Avoid heavy lifting, wear low heel shoes, and practice good posture. A sexual relationship may be continued unless your health care provider directs you otherwise. Relieving pain and discomfort Wear a good support bra to prevent discomfort from breast tenderness. Take warm sitz baths to soothe any pain or discomfort caused by hemorrhoids. Use hemorrhoid cream if your health care provider approves. Rest with your legs elevated if you have leg cramps or low back pain. If you develop varicose veins, wear support hose. Elevate your feet for 15 minutes, 3-4 times a day. Limit salt in your diet. Prenatal Care Write down your questions. Take them to your prenatal visits. Keep all your prenatal visits as told by your health care provider.  This is important. Safety Wear your seat belt at all times when driving. Make a list of emergency phone numbers, including numbers for family, friends, the hospital, and police and fire departments. General instructions Ask your health care provider for a referral to a local prenatal education class. Begin classes no later than the beginning of month 6 of your pregnancy. Ask for help if you have counseling or nutritional needs during pregnancy. Your health care provider can offer advice or refer you to specialists for help with various needs. Do not use hot tubs, steam rooms, or saunas. Do not douche or use tampons or scented sanitary pads. Do not cross your legs for long periods of time. Avoid cat litter boxes and soil used by cats. These carry germs that can cause birth defects in the baby and possibly loss of the  fetus by miscarriage or stillbirth. Avoid all smoking, herbs, alcohol, and unprescribed drugs. Chemicals in these products can affect the formation and growth of the baby. Do not use any products that contain nicotine or tobacco, such as cigarettes and e-cigarettes. If you need help quitting, ask your health care provider. Visit your dentist if you have not gone yet during your pregnancy. Use a soft toothbrush to brush your teeth and be gentle when you floss. Contact a health care provider if: You have dizziness. You have mild pelvic cramps, pelvic pressure, or nagging pain in the abdominal area. You have persistent nausea, vomiting, or diarrhea. You have a bad smelling vaginal discharge. You have pain when you urinate. Get help right away if: You have a fever. You are leaking fluid from your vagina. You have spotting or bleeding from your vagina. You have severe abdominal cramping or pain. You have rapid weight gain or weight loss. You have shortness of breath with chest pain. You notice sudden or extreme swelling of your face, hands, ankles, feet, or legs. You have not felt  your baby move in over an hour. You have severe headaches that do not go away when you take medicine. You have vision changes. Summary The second trimester is from week 14 through week 27 (months 4 through 6). It is also a time when the fetus is growing rapidly. Your body goes through many changes during pregnancy. The changes vary from woman to woman. Avoid all smoking, herbs, alcohol, and unprescribed drugs. These chemicals affect the formation and growth your baby. Do not use any tobacco products, such as cigarettes, chewing tobacco, and e-cigarettes. If you need help quitting, ask your health care provider. Contact your health care provider if you have any questions. Keep all prenatal visits as told by your health care provider. This is important. This information is not intended to replace advice given to you by your health care provider. Make sure you discuss any questions you have with your health care provider. Document Released: 10/18/2001 Document Revised: 03/31/2016 Document Reviewed: 12/25/2012 Elsevier Interactive Patient Education  2017 [employer].

## 2022-06-01 ENCOUNTER — Encounter: Payer: Self-pay | Admitting: Advanced Practice Midwife

## 2022-06-01 ENCOUNTER — Ambulatory Visit (INDEPENDENT_AMBULATORY_CARE_PROVIDER_SITE_OTHER): Payer: Medicaid Other | Admitting: Advanced Practice Midwife

## 2022-06-01 VITALS — BP 127/87 | HR 89 | Wt 173.0 lb

## 2022-06-01 DIAGNOSIS — Z3480 Encounter for supervision of other normal pregnancy, unspecified trimester: Secondary | ICD-10-CM

## 2022-06-01 DIAGNOSIS — Z3A25 25 weeks gestation of pregnancy: Secondary | ICD-10-CM

## 2022-06-01 NOTE — Patient Instructions (Signed)
Kelsey Lambert, I greatly value your feedback.  If you receive a survey following your visit with [place] today, we appreciate you taking the time to fill it out.  Thanks, Philipp Deputy, CNM   You will have your sugar test next visit.  Please do not eat or drink anything after midnight the night before you come, not even water.  You will be here for at least two hours.  Please make an appointment online for the bloodwork at [email or web address] for 8:30am (or as close to this as possible). Make sure you select the [facility] service center. The day of the appointment, check in with our office first, then you will go to [facility] to start the sugar test.    [facility] HAS MOVED!!! It is now [facility] at [facility] ([address] [place], [place] [address]) Entrance C, located off of [address] valet parking  Go to [email or web address] to register for FREE online childbirth classes   Call the office [phone]) or go to [facility] if: You begin to have strong, frequent contractions Your water breaks.  Sometimes it is a big gush of fluid, sometimes it is just a trickle that keeps getting your panties wet or running down your legs You have vaginal bleeding.  It is normal to have a small amount of spotting if your cervix was checked.  You don't feel your baby moving like normal.  If you don't, get you something to eat and drink and lay down and focus on feeling your baby move.   If your baby is still not moving like normal, you should call the office or go to [facility].  [place] Pediatricians/Family Doctors: [facility] [phone]           [facility] [phone]                [facility] [phone] (usually not accepting new patients unless you have family there already, you are always welcome to call and ask)      [facility] Department [phone]       [facility] Pediatricians/Family Doctors:  [facility]: [phone] [facility]: [phone] [facility]: [phone]  [facility] Doctors:  [facility]: [phone]  [facility]: [phone]  [facility] Doctors: [facility]: [phone]   Home Blood Pressure Monitoring for Patients   Your provider has recommended that you check your blood pressure (BP) at least once a week at home. If you do not have a blood pressure cuff at home, one will be provided for you. Contact your provider if you have not received your monitor within 1 week.   Helpful Tips for Accurate Home Blood Pressure Checks  Don't smoke, exercise, or drink caffeine 30 minutes before checking your BP Use the restroom before checking your BP (a full bladder can raise your pressure) Relax in a comfortable upright chair Feet on the ground Left arm resting comfortably on a flat surface at the level of your heart Legs uncrossed Back supported Sit quietly and don't talk Place the cuff on your bare arm Adjust snuggly, so that only two fingertips can fit between your skin and the top of the cuff Check 2 readings separated by at least one minute Keep a log of your BP readings For a visual, please reference this diagram: http://ccnc.care/bpdiagram  Provider Name: [facility]     Phone: [phone]  Zone 1: ALL CLEAR  Continue to monitor your symptoms:  BP reading is less than 140 (top number) or less than 90 (bottom number)  No right upper stomach pain No headaches or seeing spots No feeling nauseated or throwing up No swelling in face and hands  Zone 2: CAUTION Call your doctor's office for any of the following:  BP reading is greater than 140 (top number) or greater than 90 (bottom number)  Stomach pain under your ribs in the middle or right side Headaches or seeing spots Feeling nauseated or throwing up Swelling in face and hands  Zone 3: EMERGENCY  Seek  immediate medical care if you have any of the following:  BP reading is greater than160 (top number) or greater than 110 (bottom number) Severe headaches not improving with Tylenol Serious difficulty catching your breath Any worsening symptoms from Zone 2   Second Trimester of Pregnancy The second trimester is from week 13 through week 28, months 4 through 6. The second trimester is often a time when you feel your best. Your body has also adjusted to being pregnant, and you begin to feel better physically. Usually, morning sickness has lessened or quit completely, you may have more energy, and you may have an increase in appetite. The second trimester is also a time when the fetus is growing rapidly. At the end of the sixth month, the fetus is about 9 inches long and weighs about 1 pounds. You will likely begin to feel the baby move (quickening) between 18 and 20 weeks of the pregnancy. BODY CHANGES Your body goes through many changes during pregnancy. The changes vary from woman to woman.  Your weight will continue to increase. You will notice your lower abdomen bulging out. You may begin to get stretch marks on your hips, abdomen, and breasts. You may develop headaches that can be relieved by medicines approved by your health care provider. You may urinate more often because the fetus is pressing on your bladder. You may develop or continue to have heartburn as a result of your pregnancy. You may develop constipation because certain hormones are causing the muscles that push waste through your intestines to slow down. You may develop hemorrhoids or swollen, bulging veins (varicose veins). You may have back pain because of the weight gain and pregnancy hormones relaxing your joints between the bones in your pelvis and as a result of a shift in weight and the muscles that support your balance. Your breasts will continue to grow and be tender. Your gums may bleed and may be sensitive to brushing  and flossing. Dark spots or blotches (chloasma, mask of pregnancy) may develop on your face. This will likely fade after the baby is born. A dark line from your belly button to the pubic area (linea nigra) may appear. This will likely fade after the baby is born. You may have changes in your hair. These can include thickening of your hair, rapid growth, and changes in texture. Some women also have hair loss during or after pregnancy, or hair that feels dry or thin. Your hair will most likely return to normal after your baby is born. WHAT TO EXPECT AT YOUR PRENATAL VISITS During a routine prenatal visit: You will be weighed to make sure you and the fetus are growing normally. Your blood pressure will be taken. Your abdomen will be measured to track your baby's growth. The fetal heartbeat will be listened to. Any test results from the previous visit will be discussed. Your health care provider  may ask you: How you are feeling. If you are feeling the baby move. If you have had any abnormal symptoms, such as leaking fluid, bleeding, severe headaches, or abdominal cramping. If you have any questions. Other tests that may be performed during your second trimester include: Blood tests that check for: Low iron levels (anemia). Gestational diabetes (between 24 and 28 weeks). Rh antibodies. Urine tests to check for infections, diabetes, or protein in the urine. An ultrasound to confirm the proper growth and development of the baby. An amniocentesis to check for possible genetic problems. Fetal screens for spina bifida and Down syndrome. HOME CARE INSTRUCTIONS  Avoid all smoking, herbs, alcohol, and unprescribed drugs. These chemicals affect the formation and growth of the baby. Follow your health care provider's instructions regarding medicine use. There are medicines that are either safe or unsafe to take during pregnancy. Exercise only as directed by your health care provider. Experiencing  uterine cramps is a good sign to stop exercising. Continue to eat regular, healthy meals. Wear a good support bra for breast tenderness. Do not use hot tubs, steam rooms, or saunas. Wear your seat belt at all times when driving. Avoid raw meat, uncooked cheese, cat litter boxes, and soil used by cats. These carry germs that can cause birth defects in the baby. Take your prenatal vitamins. Try taking a stool softener (if your health care provider approves) if you develop constipation. Eat more high-fiber foods, such as fresh vegetables or fruit and whole grains. Drink plenty of fluids to keep your urine clear or pale yellow. Take warm sitz baths to soothe any pain or discomfort caused by hemorrhoids. Use hemorrhoid cream if your health care provider approves. If you develop varicose veins, wear support hose. Elevate your feet for 15 minutes, 3-4 times a day. Limit salt in your diet. Avoid heavy lifting, wear low heel shoes, and practice good posture. Rest with your legs elevated if you have leg cramps or low back pain. Visit your dentist if you have not gone yet during your pregnancy. Use a soft toothbrush to brush your teeth and be gentle when you floss. A sexual relationship may be continued unless your health care provider directs you otherwise. Continue to go to all your prenatal visits as directed by your health care provider. SEEK MEDICAL CARE IF:  You have dizziness. You have mild pelvic cramps, pelvic pressure, or nagging pain in the abdominal area. You have persistent nausea, vomiting, or diarrhea. You have a bad smelling vaginal discharge. You have pain with urination. SEEK IMMEDIATE MEDICAL CARE IF:  You have a fever. You are leaking fluid from your vagina. You have spotting or bleeding from your vagina. You have severe abdominal cramping or pain. You have rapid weight gain or loss. You have shortness of breath with chest pain. You notice sudden or extreme swelling of your face,  hands, ankles, feet, or legs. You have not felt your baby move in over an hour. You have severe headaches that do not go away with medicine. You have vision changes. Document Released: 10/18/2001 Document Revised: 10/29/2013 Document Reviewed: 12/25/2012 [facility] Patient Information 2015 [place], [place]. This information is not intended to replace advice given to you by your health care provider. Make sure you discuss any questions you have with your health care provider.

## 2022-06-01 NOTE — Progress Notes (Signed)
   LOW-RISK PREGNANCY VISIT Patient name: Kelsey Lambert MRN [id number]  Date of birth: August 25, 1996 Chief Complaint:   Routine Prenatal Visit  History of Present Illness:   Kelsey Lambert is a 26 y.o. G29P1001 female at [redacted]w[redacted]d with an Estimated Date of Delivery: 09/11/22 being seen today for ongoing management of a low-risk pregnancy.  Today she reports no complaints. Contractions: Regular.  .  Movement: Present. denies leaking of fluid. Review of Systems:   Pertinent items are noted in HPI Denies abnormal vaginal discharge w/ itching/odor/irritation, headaches, visual changes, shortness of breath, chest pain, abdominal pain, severe nausea/vomiting, or problems with urination or bowel movements unless otherwise stated above. Pertinent History Reviewed:  Reviewed past medical,surgical, social, obstetrical and family history.  Reviewed problem list, medications and allergies. Physical Assessment:   Vitals:   06/01/22 1404  BP: 127/87  Pulse: 89  Weight: 173 lb (78.5 kg)  Body mass index is 35.54 kg/m.        Physical Examination:   General appearance: Well appearing, and in no distress  Mental status: Alert, oriented to person, place, and time  Skin: Warm & dry  Cardiovascular: Normal heart rate noted  Respiratory: Normal respiratory effort, no distress  Abdomen: Soft, gravid, nontender  Pelvic: Cervical exam deferred         Extremities: Edema: None  Fetal Status: Fetal Heart Rate (bpm): 157 Fundal Height: 26 cm Movement: Present    No results found for this or any previous visit (from the past 24 hour(s)).  Assessment & Plan:  1) Low-risk pregnancy G2P1001 at [redacted]w[redacted]d with an Estimated Date of Delivery: 09/11/22   2) Rh neg, Rhogam after next visit  3) Desires ppBTL, will sign 30d papers next visit  4) Hx gHTN/severe pre-e, taking bASA   Meds: No orders of the defined types were placed in this encounter.  Labs/procedures today: none  Plan:  Continue routine obstetrical care    Reviewed: Preterm labor symptoms and general obstetric precautions including but not limited to vaginal bleeding, contractions, leaking of fluid and fetal movement were reviewed in detail with the patient.  All questions were answered. Has home bp cuff. Check bp weekly, let us know if >140/90.   Follow-up: Return in about 3 weeks (around 06/22/2022) for LROB, PN2.  No orders of the defined types were placed in this encounter.  Arabella Merles CNM 06/01/2022 2:15 PM

## 2022-06-21 ENCOUNTER — Encounter: Payer: Self-pay | Admitting: Women's Health

## 2022-06-21 ENCOUNTER — Other Ambulatory Visit: Payer: Medicaid Other

## 2022-06-21 ENCOUNTER — Ambulatory Visit (INDEPENDENT_AMBULATORY_CARE_PROVIDER_SITE_OTHER): Payer: Medicaid Other | Admitting: Women's Health

## 2022-06-21 VITALS — BP 118/81 | HR 79 | Wt 183.0 lb

## 2022-06-21 DIAGNOSIS — Z23 Encounter for immunization: Secondary | ICD-10-CM

## 2022-06-21 DIAGNOSIS — Z3480 Encounter for supervision of other normal pregnancy, unspecified trimester: Secondary | ICD-10-CM

## 2022-06-21 DIAGNOSIS — Z131 Encounter for screening for diabetes mellitus: Secondary | ICD-10-CM

## 2022-06-21 DIAGNOSIS — Z3A28 28 weeks gestation of pregnancy: Secondary | ICD-10-CM

## 2022-06-21 DIAGNOSIS — Z3483 Encounter for supervision of other normal pregnancy, third trimester: Secondary | ICD-10-CM

## 2022-06-21 NOTE — Patient Instructions (Signed)
Kelsey Lambert, thank you for choosing our office today! We appreciate the opportunity to meet your healthcare needs. You may receive a short survey by mail, e-mail, or through [employer]. If you are happy with your care we would appreciate if you could take just a few minutes to complete the survey questions. We read all of your comments and take your feedback very seriously. Thank you again for choosing our office.  Center for [employer] Team at [place] at [facility] ([place], [place] [address]) Entrance C, located off of [place] parking   CLASSES: Go to [email or web address] to register for classes (childbirth, breastfeeding, waterbirth, infant CPR, daddy bootcamp, etc.)  Call the office [phone]) or go to [facility] if: You begin to have strong, frequent contractions Your water breaks.  Sometimes it is a big gush of fluid, sometimes it is just a trickle that keeps getting your panties wet or running down your legs You have vaginal bleeding.  It is normal to have a small amount of spotting if your cervix was checked.  You don't feel your baby moving like normal.  If you don't, get you something to eat and drink and lay down and focus on feeling your baby move.   If your baby is still not moving like normal, you should call the office or go to [facility].  Call the office [phone]) or go to [facility] hospital for these signs of pre-eclampsia: Severe headache that does not go away with Tylenol Visual changes- seeing spots, double, blurred vision Pain under your right breast or upper abdomen that does not go away with Tums or heartburn medicine Nausea and/or vomiting Severe swelling in your hands, feet, and face   Tdap Vaccine It is recommended that you get the Tdap vaccine during the third trimester of EACH pregnancy to help protect your baby from getting pertussis (whooping cough) 27-36 weeks is the BEST time to do this  so that you can pass the protection on to your baby. During pregnancy is better than after pregnancy, but if you are unable to get it during pregnancy it will be offered at the hospital.  You can get this vaccine with [place], at the health department, your family doctor, or some local pharmacies Everyone who will be around your baby should also be up-to-date on their vaccines before the baby comes. Adults (who are not pregnant) only need 1 dose of Tdap during adulthood.   [facility] Pediatricians/Family Doctors [facility]): [address] [place], [facility]: [address] [place], [phone]                [facility]): [place], [phone] (call to ask if accepting patients) [facility] Department: [address], [place], [place] Pediatricians/Family Doctors [facility]): [place]. [place], [address], [facility]: [address] [place], [place] [facility]: [place], [facility]): [phone] [facility]: [address] [place]: [address], [facility]: [phone], [phone]  Home Blood Pressure Monitoring for Patients   Your provider has recommended that you check your  blood pressure (BP) at least once a week at home. If you do not have a blood pressure cuff at home, one will be provided for you. Contact your provider if you have not received your monitor within 1 week.   Helpful Tips for Accurate Home Blood Pressure Checks  Don't smoke, exercise, or drink caffeine 30 minutes before checking your BP Use the restroom before checking your BP (a full bladder can raise your  pressure) Relax in a comfortable upright chair Feet on the ground Left arm resting comfortably on a flat surface at the level of your heart Legs uncrossed Back supported Sit quietly and don't talk Place the cuff on your bare arm Adjust snuggly, so that only two fingertips can fit between your skin and the top of the cuff Check 2 readings separated by at least one minute Keep a log of your BP readings For a visual, please reference this diagram: http://ccnc.care/bpdiagram  Provider Name: [facility]     Phone: [phone]  Zone 1: ALL CLEAR  Continue to monitor your symptoms:  BP reading is less than 140 (top number) or less than 90 (bottom number)  No right upper stomach pain No headaches or seeing spots No feeling nauseated or throwing up No swelling in face and hands  Zone 2: CAUTION Call your doctor's office for any of the following:  BP reading is greater than 140 (top number) or greater than 90 (bottom number)  Stomach pain under your ribs in the middle or right side Headaches or seeing spots Feeling nauseated or throwing up Swelling in face and hands  Zone 3: EMERGENCY  Seek immediate medical care if you have any of the following:  BP reading is greater than160 (top number) or greater than 110 (bottom number) Severe headaches not improving with Tylenol Serious difficulty catching your breath Any worsening symptoms from Zone 2   Third Trimester of Pregnancy The third trimester is from week 29 through week 42, months 7 through 9. The third trimester is a time when the fetus is growing rapidly. At the end of the ninth month, the fetus is about 20 inches in length and weighs 6-10 pounds.  BODY CHANGES Your body goes through many changes during pregnancy. The changes vary from woman to woman.  Your weight will continue to increase. You can expect to gain 25-35 pounds (11-16 kg) by the end of the pregnancy. You may begin to get stretch marks on your hips, abdomen,  and breasts. You may urinate more often because the fetus is moving lower into your pelvis and pressing on your bladder. You may develop or continue to have heartburn as a result of your pregnancy. You may develop constipation because certain hormones are causing the muscles that push waste through your intestines to slow down. You may develop hemorrhoids or swollen, bulging veins (varicose veins). You may have pelvic pain because of the weight gain and pregnancy hormones relaxing your joints between the bones in your pelvis. Backaches may result from overexertion of the muscles supporting your posture. You may have changes in your hair. These can include thickening of your hair, rapid growth, and changes in texture. Some women also have hair loss during or after pregnancy, or hair that feels dry or thin. Your hair will most likely return to normal after your baby is born. Your breasts will continue to grow and be tender. A yellow discharge may leak from your breasts called colostrum. Your belly button may stick out. You may   feel short of breath because of your expanding uterus. You may notice the fetus "dropping," or moving lower in your abdomen. You may have a bloody mucus discharge. This usually occurs a few days to a week before labor begins. Your cervix becomes thin and soft (effaced) near your due date. WHAT TO EXPECT AT YOUR PRENATAL EXAMS  You will have prenatal exams every 2 weeks until week 36. Then, you will have weekly prenatal exams. During a routine prenatal visit: You will be weighed to make sure you and the fetus are growing normally. Your blood pressure is taken. Your abdomen will be measured to track your baby's growth. The fetal heartbeat will be listened to. Any test results from the previous visit will be discussed. You may have a cervical check near your due date to see if you have effaced. At around 36 weeks, your caregiver will check your cervix. At the same time, your  caregiver will also perform a test on the secretions of the vaginal tissue. This test is to determine if a type of bacteria, Group B streptococcus, is present. Your caregiver will explain this further. Your caregiver may ask you: What your birth plan is. How you are feeling. If you are feeling the baby move. If you have had any abnormal symptoms, such as leaking fluid, bleeding, severe headaches, or abdominal cramping. If you have any questions. Other tests or screenings that may be performed during your third trimester include: Blood tests that check for low iron levels (anemia). Fetal testing to check the health, activity level, and growth of the fetus. Testing is done if you have certain medical conditions or if there are problems during the pregnancy. FALSE LABOR You may feel small, irregular contractions that eventually go away. These are called Braxton Hicks contractions, or false labor. Contractions may last for hours, days, or even weeks before true labor sets in. If contractions come at regular intervals, intensify, or become painful, it is best to be seen by your caregiver.  SIGNS OF LABOR  Menstrual-like cramps. Contractions that are 5 minutes apart or less. Contractions that start on the top of the uterus and spread down to the lower abdomen and back. A sense of increased pelvic pressure or back pain. A watery or bloody mucus discharge that comes from the vagina. If you have any of these signs before the 37th week of pregnancy, call your caregiver right away. You need to go to the hospital to get checked immediately. HOME CARE INSTRUCTIONS  Avoid all smoking, herbs, alcohol, and unprescribed drugs. These chemicals affect the formation and growth of the baby. Follow your caregiver's instructions regarding medicine use. There are medicines that are either safe or unsafe to take during pregnancy. Exercise only as directed by your caregiver. Experiencing uterine cramps is a good sign to  stop exercising. Continue to eat regular, healthy meals. Wear a good support bra for breast tenderness. Do not use hot tubs, steam rooms, or saunas. Wear your seat belt at all times when driving. Avoid raw meat, uncooked cheese, cat litter boxes, and soil used by cats. These carry germs that can cause birth defects in the baby. Take your prenatal vitamins. Try taking a stool softener (if your caregiver approves) if you develop constipation. Eat more high-fiber foods, such as fresh vegetables or fruit and whole grains. Drink plenty of fluids to keep your urine clear or pale yellow. Take warm sitz baths to soothe any pain or discomfort caused by hemorrhoids. Use hemorrhoid cream if   your caregiver approves. If you develop varicose veins, wear support hose. Elevate your feet for 15 minutes, 3-4 times a day. Limit salt in your diet. Avoid heavy lifting, wear low heal shoes, and practice good posture. Rest a lot with your legs elevated if you have leg cramps or low back pain. Visit your dentist if you have not gone during your pregnancy. Use a soft toothbrush to brush your teeth and be gentle when you floss. A sexual relationship may be continued unless your caregiver directs you otherwise. Do not travel far distances unless it is absolutely necessary and only with the approval of your caregiver. Take prenatal classes to understand, practice, and ask questions about the labor and delivery. Make a trial run to the hospital. Pack your hospital bag. Prepare the baby's nursery. Continue to go to all your prenatal visits as directed by your caregiver. SEEK MEDICAL CARE IF: You are unsure if you are in labor or if your water has broken. You have dizziness. You have mild pelvic cramps, pelvic pressure, or nagging pain in your abdominal area. You have persistent nausea, vomiting, or diarrhea. You have a bad smelling vaginal discharge. You have pain with urination. SEEK IMMEDIATE MEDICAL CARE IF:  You  have a fever. You are leaking fluid from your vagina. You have spotting or bleeding from your vagina. You have severe abdominal cramping or pain. You have rapid weight loss or gain. You have shortness of breath with chest pain. You notice sudden or extreme swelling of your face, hands, ankles, feet, or legs. You have not felt your baby move in over an hour. You have severe headaches that do not go away with medicine. You have vision changes. Document Released: 10/18/2001 Document Revised: 10/29/2013 Document Reviewed: 12/25/2012 ExitCare Patient Information 2015 [employer]. This information is not intended to replace advice given to you by your health care provider. Make sure you discuss any questions you have with your health care provider.       

## 2022-06-21 NOTE — Progress Notes (Signed)
LOW-RISK PREGNANCY VISIT Patient name: Kelsey Lambert MRN [id number]  Date of birth: [birth year] Chief Complaint:   Routine Prenatal Visit  History of Present Illness:   Kelsey Lambert is a 25 y.o. G107P1001 female at [redacted]w[redacted]d with an Estimated Date of Delivery: 09/11/22 being seen today for ongoing management of a low-risk pregnancy.   Today she reports no complaints. Wants BTL.  Contractions: Not present. Vag. Bleeding: None.  Movement: Present. denies leaking of fluid.     06/21/2022    9:18 AM 03/18/2022    9:57 AM 02/10/2020    9:23 AM 01/10/2020   10:00 AM 01/10/2020    9:47 AM  Depression screen PHQ 2/9  Decreased Interest 2 0 1 2 3   Down, Depressed, Hopeless 2 0 0 0 0  PHQ - 2 Score 4 0 1 2 3   Altered sleeping 2 1 2 2 1   Tired, decreased energy 2 1 2 1  0  Change in appetite 0 1 2 2 1   Feeling bad or failure about yourself  2 0 0 0 0  Trouble concentrating 2 0 2 0 0  Moving slowly or fidgety/restless 0 0 0 0 0  Suicidal thoughts 0 0 0 0 0  PHQ-9 Score 12 3 9 7 5   Difficult doing work/chores     Not difficult at all        06/21/2022    9:18 AM 03/18/2022    9:57 AM 02/10/2020    9:23 AM  GAD 7 : Generalized Anxiety Score  Nervous, Anxious, on Edge 2 2 1   Control/stop worrying 2 0 1  Worry too much - different things 2 2 1   Trouble relaxing 2 1 1   Restless 2 1 1   Easily annoyed or irritable 2 1 1   Afraid - awful might happen 2 0 1  Total GAD 7 Score 14 7 7       Review of Systems:   Pertinent items are noted in HPI Denies abnormal vaginal discharge w/ itching/odor/irritation, headaches, visual changes, shortness of breath, chest pain, abdominal pain, severe nausea/vomiting, or problems with urination or bowel movements unless otherwise stated above. Pertinent History Reviewed:  Reviewed past medical,surgical, social, obstetrical and family history.  Reviewed problem list, medications and allergies. Physical Assessment:   Vitals:   06/21/22 0912  BP: 118/81  Pulse:  79  Weight: 183 lb (83 kg)  Body mass index is 37.6 kg/m.        Physical Examination:   General appearance: Well appearing, and in no distress  Mental status: Alert, oriented to person, place, and time  Skin: Warm & dry  Cardiovascular: Normal heart rate noted  Respiratory: Normal respiratory effort, no distress  Abdomen: Soft, gravid, nontender  Pelvic: Cervical exam deferred         Extremities: Edema: None  Fetal Status: Fetal Heart Rate (bpm): 143 Fundal Height: 29 cm Movement: Present    Chaperone: N/A   No results found for this or any previous visit (from the past 24 hour(s)).  Assessment & Plan:  1) Low-risk pregnancy G2P1001 at [redacted]w[redacted]d with an Estimated Date of Delivery: 09/11/22   2) H/O severe pre-e, ASA, reviewed s/s, reasons to seek care  3) H/O VAVB w/ 20 sec shoulder dystocia  4) Wants BTL>reviewed risks/benefits, discussed high incidence regret <30yo if appropriate, LARCs just as effective, consent signed today    Meds: No orders of the defined types were placed in this encounter.  Labs/procedures today: tdap and PN2  Plan:  Continue routine obstetrical care  Next visit: prefers in person    Reviewed: Preterm labor symptoms and general obstetric precautions including but not limited to vaginal bleeding, contractions, leaking of fluid and fetal movement were reviewed in detail with the patient.  All questions were answered. Does have home bp cuff. Office bp cuff given: not applicable. Check bp weekly, let us know if consistently >140 and/or >90.  Follow-up: Return in about 2 weeks (around 07/05/2022) for LROB, CNM, in person.  No future appointments.  Orders Placed This Encounter  Procedures   Tdap vaccine greater than or equal to 7yo IM   Cheral Marker CNM, [facility] 06/21/2022 9:45 AM

## 2022-06-22 LAB — ANTIBODY SCREEN: Antibody Screen: NEGATIVE

## 2022-06-22 LAB — CBC
Hematocrit: 34.4 % (ref 34.0–46.6)
Hemoglobin: 11.3 g/dL (ref 11.1–15.9)
MCH: 28.8 pg (ref 26.6–33.0)
MCHC: 32.8 g/dL (ref 31.5–35.7)
MCV: 88 fL (ref 79–97)
Platelets: 196 10*3/uL (ref 150–450)
RBC: 3.92 x10E6/uL (ref 3.77–5.28)
RDW: 13.4 % (ref 11.7–15.4)
WBC: 12.5 10*3/uL — ABNORMAL HIGH (ref 3.4–10.8)

## 2022-06-22 LAB — HIV ANTIBODY (ROUTINE TESTING W REFLEX): HIV Screen 4th Generation wRfx: NONREACTIVE

## 2022-06-22 LAB — GLUCOSE TOLERANCE, 2 HOURS W/ 1HR
Glucose, 1 hour: 123 mg/dL (ref 70–179)
Glucose, 2 hour: 125 mg/dL (ref 70–152)
Glucose, Fasting: 81 mg/dL (ref 70–91)

## 2022-06-22 LAB — RPR: RPR Ser Ql: NONREACTIVE

## 2022-07-05 ENCOUNTER — Encounter: Payer: Self-pay | Admitting: Obstetrics & Gynecology

## 2022-07-05 ENCOUNTER — Ambulatory Visit (INDEPENDENT_AMBULATORY_CARE_PROVIDER_SITE_OTHER): Payer: Medicaid Other | Admitting: Obstetrics & Gynecology

## 2022-07-05 VITALS — BP 119/82 | HR 88 | Wt 183.2 lb

## 2022-07-05 DIAGNOSIS — Z3483 Encounter for supervision of other normal pregnancy, third trimester: Secondary | ICD-10-CM

## 2022-07-05 NOTE — Progress Notes (Signed)
   LOW-RISK PREGNANCY VISIT Patient name: Kelsey Lambert MRN [id number]  Date of birth: September 16, 1996 Chief Complaint:   Routine Prenatal Visit  History of Present Illness:   Kelsey Lambert is a 26 y.o. G70P1001 female at [redacted]w[redacted]d with an Estimated Date of Delivery: 09/11/22 being seen today for ongoing management of a low-risk pregnancy.      06/21/2022    9:18 AM 03/18/2022    9:57 AM 02/10/2020    9:23 AM 01/10/2020   10:00 AM 01/10/2020    9:47 AM  Depression screen PHQ 2/9  Decreased Interest 2 0 1 2 3   Down, Depressed, Hopeless 2 0 0 0 0  PHQ - 2 Score 4 0 1 2 3   Altered sleeping 2 1 2 2 1   Tired, decreased energy 2 1 2 1  0  Change in appetite 0 1 2 2 1   Feeling bad or failure about yourself  2 0 0 0 0  Trouble concentrating 2 0 2 0 0  Moving slowly or fidgety/restless 0 0 0 0 0  Suicidal thoughts 0 0 0 0 0  PHQ-9 Score 12 3 9 7 5   Difficult doing work/chores     Not difficult at all    Today she reports  some nausea/vomiting- no improvement with meds, doing ok without . Contractions: Not present. Vag. Bleeding: None.  Movement: Present. denies leaking of fluid. Review of Systems:   Pertinent items are noted in HPI Denies abnormal vaginal discharge w/ itching/odor/irritation, headaches, visual changes, shortness of breath, chest pain, abdominal pain, severe nausea/vomiting, or problems with urination or bowel movements unless otherwise stated above. Pertinent History Reviewed:  Reviewed past medical,surgical, social, obstetrical and family history.  Reviewed problem list, medications and allergies.  Physical Assessment:   Vitals:   07/05/22 1355  BP: 119/82  Pulse: 88  Weight: 183 lb 3.2 oz (83.1 kg)  Body mass index is 37.64 kg/m.        Physical Examination:   General appearance: Well appearing, and in no distress  Mental status: Alert, oriented to person, place, and time  Skin: Warm & dry  Respiratory: Normal respiratory effort, no distress  Abdomen: Soft, gravid,  nontender  Pelvic: Cervical exam deferred         Extremities: Edema: None  Psych:  mood and affect appropriate  Fetal Status: Fetal Heart Rate (bpm): 150 Fundal Height: 31 cm Movement: Present    Chaperone: n/a    No results found for this or any previous visit (from the past 24 hour(s)).   Assessment & Plan:  1) Low-risk pregnancy G2P1001 at [redacted]w[redacted]d with an Estimated Date of Delivery: 09/11/22   Meds: No orders of the defined types were placed in this encounter.  Labs/procedures today: none  Plan:  Continue routine obstetrical care  Next visit: prefers in person    Reviewed: Preterm labor symptoms and general obstetric precautions including but not limited to vaginal bleeding, contractions, leaking of fluid and fetal movement were reviewed in detail with the patient.  All questions were answered. Pt has home bp cuff. Check bp weekly, let know if >140/90.   Follow-up: Return in about 2 weeks (around 07/19/2022) for LROB visit.  Orders Placed This Encounter  Procedures   RHO (D) Immune Globulin    , DO Attending Obstetrician & Gynecologist, [facility] for [redacted]w[redacted]d, [facility]

## 2022-07-19 ENCOUNTER — Ambulatory Visit (INDEPENDENT_AMBULATORY_CARE_PROVIDER_SITE_OTHER): Payer: Medicaid Other | Admitting: Women's Health

## 2022-07-19 ENCOUNTER — Encounter: Payer: Self-pay | Admitting: Women's Health

## 2022-07-19 VITALS — BP 136/93

## 2022-07-19 DIAGNOSIS — Z3480 Encounter for supervision of other normal pregnancy, unspecified trimester: Secondary | ICD-10-CM

## 2022-07-19 DIAGNOSIS — O1213 Gestational proteinuria, third trimester: Secondary | ICD-10-CM

## 2022-07-19 DIAGNOSIS — Z3483 Encounter for supervision of other normal pregnancy, third trimester: Secondary | ICD-10-CM

## 2022-07-19 DIAGNOSIS — R030 Elevated blood-pressure reading, without diagnosis of hypertension: Secondary | ICD-10-CM

## 2022-07-19 LAB — POCT URINALYSIS DIPSTICK OB
Blood, UA: NEGATIVE
Glucose, UA: NEGATIVE
Nitrite, UA: NEGATIVE

## 2022-07-19 NOTE — Patient Instructions (Signed)
Kelsey Lambert, thank you for choosing our office today! We appreciate the opportunity to meet your healthcare needs. You may receive a short survey by mail, e-mail, or through MyChart. If you are happy with your care we would appreciate if you could take just a few minutes to complete the survey questions. We read all of your comments and take your feedback very seriously. Thank you again for choosing our office.  [facility] ([address] [place], [place] [address]) Entrance C, located off of [address] Free 24/7 valet parking   CLASSES: Go to [email or web address] to register for classes (childbirth, breastfeeding, waterbirth, infant CPR, daddy bootcamp, etc.)  Call the office ([phone]) or go to [facility] if: You begin to have strong, frequent contractions Your water breaks.  Sometimes it is a big gush of fluid, sometimes it is just a trickle that keeps getting your panties wet or running down your legs You have vaginal bleeding.  It is normal to have a small amount of spotting if your cervix was checked.  You don't feel your baby moving like normal.  If you don't, get you something to eat and drink and lay down and focus on feeling your baby move.   If your baby is still not moving like normal, you should call the office or go to [facility].  Call the office ([phone]) or go to [facility] for these signs of pre-eclampsia: Severe headache that does not go away with Tylenol Visual changes- seeing spots, double, blurred vision Pain under your right breast or upper abdomen that does not go away with Tums or heartburn medicine Nausea and/or vomiting Severe swelling in your hands, feet, and face   Tdap Vaccine It is recommended that you get the Tdap vaccine during the third trimester of EACH pregnancy to help protect your baby from getting pertussis (whooping cough) 27-36 weeks is the BEST time to do this  so that you can pass the protection on to your baby. During pregnancy is better than after pregnancy, but if you are unable to get it during pregnancy it will be offered at the hospital.  You can get this vaccine with us, at the health department, your family doctor, or some local pharmacies Everyone who will be around your baby should also be up-to-date on their vaccines before the baby comes. Adults (who are not pregnant) only need 1 dose of Tdap during adulthood.   [place] Pediatricians/Family Doctors [facility] ([facility]): [address], [phone]           [facility]: [address], [phone]                [facility] ([facility]): [address], [phone] (call to ask if accepting patients) [facility]: [address], [place], [phone]    [place] Pediatricians/Family Doctors [facility] ([facility]): [address], [phone] [facility]: [address], [phone] [facility]: [address], [phone]  [place] [facility]  [facility] ([facility]): [phone] [facility]: [address], [phone]   [place] Family Doctors [facility]: [address], [phone]  [place] [facility]  [facility]: [address], [phone]  Home Blood Pressure Monitoring for Patients   Your provider has recommended that you check your   blood pressure (BP) at least once a week at home. If you do not have a blood pressure cuff at home, one will be provided for you. Contact your provider if you have not received your monitor within 1 week.   Helpful Tips for Accurate Home Blood Pressure Checks  Don't smoke, exercise, or drink caffeine 30 minutes before checking your BP Use the restroom before checking your BP (a full bladder can raise your  pressure) Relax in a comfortable upright chair Feet on the ground Left arm resting comfortably on a flat surface at the level of your heart Legs uncrossed Back supported Sit quietly and don't talk Place the cuff on your bare arm Adjust snuggly, so that only two fingertips can fit between your skin and the top of the cuff Check 2 readings separated by at least one minute Keep a log of your BP readings For a visual, please reference this diagram: http://ccnc.care/bpdiagram  Provider Name: [facility]     Phone: [phone]  Zone 1: ALL CLEAR  Continue to monitor your symptoms:  BP reading is less than 140 (top number) or less than 90 (bottom number)  No right upper stomach pain No headaches or seeing spots No feeling nauseated or throwing up No swelling in face and hands  Zone 2: CAUTION Call your doctor's office for any of the following:  BP reading is greater than 140 (top number) or greater than 90 (bottom number)  Stomach pain under your ribs in the middle or right side Headaches or seeing spots Feeling nauseated or throwing up Swelling in face and hands  Zone 3: EMERGENCY  Seek immediate medical care if you have any of the following:  BP reading is greater than160 (top number) or greater than 110 (bottom number) Severe headaches not improving with Tylenol Serious difficulty catching your breath Any worsening symptoms from Zone 2  Preterm Labor and Birth Information  The normal length of a pregnancy is 39-41 weeks. Preterm labor is when labor starts before 37 completed weeks of pregnancy. What are the risk factors for preterm labor? Preterm labor is more likely to occur in women who: Have certain infections during pregnancy such as a bladder infection, sexually transmitted infection, or infection inside the uterus (chorioamnionitis). Have a shorter-than-normal cervix. Have gone into preterm labor before. Have had surgery on their cervix. Are younger than age 17  or older than age 35. Are African American. Are pregnant with twins or multiple babies (multiple gestation). Take street drugs or smoke while pregnant. Do not gain enough weight while pregnant. Became pregnant shortly after having been pregnant. What are the symptoms of preterm labor? Symptoms of preterm labor include: Cramps similar to those that can happen during a menstrual period. The cramps may happen with diarrhea. Pain in the abdomen or lower back. Regular uterine contractions that may feel like tightening of the abdomen. A feeling of increased pressure in the pelvis. Increased watery or bloody mucus discharge from the vagina. Water breaking (ruptured amniotic sac). Why is it important to recognize signs of preterm labor? It is important to recognize signs of preterm labor because babies who are born prematurely may not be fully developed. This can put them at an increased risk for: Long-term (chronic) heart and lung problems. Difficulty immediately after birth with regulating body systems, including blood sugar, body temperature, heart rate, and breathing rate. Bleeding in the brain. Cerebral palsy. Learning difficulties. Death. These risks are highest for babies who are born before 34 weeks   of pregnancy. How is preterm labor treated? Treatment depends on the length of your pregnancy, your condition, and the health of your baby. It may involve: Having a stitch (suture) placed in your cervix to prevent your cervix from opening too early (cerclage). Taking or being given medicines, such as: Hormone medicines. These may be given early in pregnancy to help support the pregnancy. Medicine to stop contractions. Medicines to help mature the baby's lungs. These may be prescribed if the risk of delivery is high. Medicines to prevent your baby from developing cerebral palsy. If the labor happens before 34 weeks of pregnancy, you may need to stay in the hospital. What should I do if I  think I am in preterm labor? If you think that you are going into preterm labor, call your health care provider right away. How can I prevent preterm labor in future pregnancies? To increase your chance of having a full-term pregnancy: Do not use any tobacco products, such as cigarettes, chewing tobacco, and e-cigarettes. If you need help quitting, ask your health care provider. Do not use street drugs or medicines that have not been prescribed to you during your pregnancy. Talk with your health care provider before taking any herbal supplements, even if you have been taking them regularly. Make sure you gain a healthy amount of weight during your pregnancy. Watch for infection. If you think that you might have an infection, get it checked right away. Make sure to tell your health care provider if you have gone into preterm labor before. This information is not intended to replace advice given to you by your health care provider. Make sure you discuss any questions you have with your health care provider. Document Revised: 02/15/2019 Document Reviewed: 03/16/2016 Elsevier Patient Education  2020 Elsevier Inc.   

## 2022-07-19 NOTE — Progress Notes (Signed)
LOW-RISK PREGNANCY VISIT Patient name: Kelsey Lambert MRN [id number]  Date of birth: [birth year] Chief Complaint:   Routine Prenatal Visit  History of Present Illness:   Kelsey Lambert is a 26 y.o. G11P1001 female at [redacted]w[redacted]d with an Estimated Date of Delivery: 09/11/22 being seen today for ongoing management of a low-risk pregnancy.   Today she reports no complaints. Denies ha, visual changes, ruq/epigastric pain, n/v.  Home bp 2d ago 120s/80s. Stressed this am, sister's child sick at school and she needs to go get her.  Contractions: Not present. Vag. Bleeding: None.  Movement: Present. denies leaking of fluid.     06/21/2022    9:18 AM 03/18/2022    9:57 AM 02/10/2020    9:23 AM 01/10/2020   10:00 AM 01/10/2020    9:47 AM  Depression screen PHQ 2/9  Decreased Interest 2 0 1 2 3   Down, Depressed, Hopeless 2 0 0 0 0  PHQ - 2 Score 4 0 1 2 3   Altered sleeping 2 1 2 2 1   Tired, decreased energy 2 1 2 1  0  Change in appetite 0 1 2 2 1   Feeling bad or failure about yourself  2 0 0 0 0  Trouble concentrating 2 0 2 0 0  Moving slowly or fidgety/restless 0 0 0 0 0  Suicidal thoughts 0 0 0 0 0  PHQ-9 Score 12 3 9 7 5   Difficult doing work/chores     Not difficult at all        06/21/2022    9:18 AM 03/18/2022    9:57 AM 02/10/2020    9:23 AM  GAD 7 : Generalized Anxiety Score  Nervous, Anxious, on Edge 2 2 1   Control/stop worrying 2 0 1  Worry too much - different things 2 2 1   Trouble relaxing 2 1 1   Restless 2 1 1   Easily annoyed or irritable 2 1 1   Afraid - awful might happen 2 0 1  Total GAD 7 Score 14 7 7       Review of Systems:   Pertinent items are noted in HPI Denies abnormal vaginal discharge w/ itching/odor/irritation, headaches, visual changes, shortness of breath, chest pain, abdominal pain, severe nausea/vomiting, or problems with urination or bowel movements unless otherwise stated above. Pertinent History Reviewed:  Reviewed past medical,surgical, social, obstetrical  and family history.  Reviewed problem list, medications and allergies. Physical Assessment:   Vitals:   07/19/22 0911 07/19/22 0912  BP: 139/85 (!) 136/93  There is no height or weight on file to calculate BMI.        Physical Examination:   General appearance: Well appearing, and in no distress  Mental status: Alert, oriented to person, place, and time  Skin: Warm & dry  Cardiovascular: Normal heart rate noted  Respiratory: Normal respiratory effort, no distress  Abdomen: Soft, gravid, nontender  Pelvic: Cervical exam deferred         Extremities: Edema: None  Fetal Status: Fetal Heart Rate (bpm): 147 Fundal Height: 33 cm Movement: Present    Chaperone: N/A   Results for orders placed or performed in visit on 07/19/22 (from the past 24 hour(s))  POC Urinalysis Dipstick OB   Collection Time: 07/19/22  9:41 AM  Result Value Ref Range   Color, UA     Clarity, UA     Glucose, UA Negative Negative   Bilirubin, UA     Ketones, UA large    Spec Grav, UA  Blood, UA neg    pH, UA     POC,PROTEIN,UA Moderate (2+) Negative, Trace, Small (1+), Moderate (2+), Large (3+), 4+   Urobilinogen, UA     Nitrite, UA neg    Leukocytes, UA Trace (A) Negative   Appearance     Odor      Assessment & Plan:  1) Low-risk pregnancy G2P1001 at [redacted]w[redacted]d with an Estimated Date of Delivery: 09/11/22   2) Elevated bp w/ h/o severe pre-e, 2+ proteinuria- will send for p:c ratio, return tomorrow for nurse bp check, check bp at home twice today and bring tomorrow. Reviewed pre-e s/s, reasons to seek care.    Meds: No orders of the defined types were placed in this encounter.  Labs/procedures today: none  Plan:  Continue routine obstetrical care  Next visit: prefers in person    Reviewed: Preterm labor symptoms and general obstetric precautions including but not limited to vaginal bleeding, contractions, leaking of fluid and fetal movement were reviewed in detail with the patient.  All questions were  answered. Does have home bp cuff. Office bp cuff given: not applicable. Check bp twice daily, let us know if consistently >140 and/or >90.  Follow-up: Return in about 1 day (around 07/20/2022) for nurse bp check.  Future Appointments  Date Time Provider Department Center  07/20/2022 10:10 AM [facility] NURSE [facility]    Orders Placed This Encounter  Procedures   Protein / creatinine ratio, urine   POC Urinalysis Dipstick OB   Cheral Marker CNM, [facility] 07/19/2022 9:54 AM

## 2022-07-20 ENCOUNTER — Ambulatory Visit (INDEPENDENT_AMBULATORY_CARE_PROVIDER_SITE_OTHER): Payer: Medicaid Other | Admitting: *Deleted

## 2022-07-20 VITALS — BP 115/80 | HR 101

## 2022-07-20 DIAGNOSIS — Z0130 Encounter for examination of blood pressure without abnormal findings: Secondary | ICD-10-CM

## 2022-07-20 LAB — PROTEIN / CREATININE RATIO, URINE
Creatinine, Urine: 193.3 mg/dL
Protein, Ur: 49.4 mg/dL
Protein/Creat Ratio: 256 mg/g creat — ABNORMAL HIGH (ref 0–200)

## 2022-07-20 NOTE — Progress Notes (Signed)
   NURSE VISIT- BLOOD PRESSURE CHECK  SUBJECTIVE:  Kelsey Lambert is a 26 y.o. G10P1001 female here for BP check. She is [redacted]w[redacted]d pregnant    HYPERTENSION ROS:  Pregnant/postpartum:  Severe headaches that don't go away with tylenol/other medicines: No  Visual changes (seeing spots/double/blurred vision) No  Severe pain under right breast breast or in center of upper chest No  Severe nausea/vomiting No  Taking medicines as instructed not applicable  OBJECTIVE:  BP 115/80   Pulse (!) 101   LMP 12/05/2021 (Exact Date)   Appearance alert, well appearing, and in no distress.  ASSESSMENT: Pregnancy [redacted]w[redacted]d  blood pressure check  PLAN: Discussed with Dr. Charlotta Newton   Recommendations: no changes needed   Follow-up: as scheduled   Annamarie Dawley  07/20/2022 10:20 AM

## 2022-08-02 ENCOUNTER — Ambulatory Visit (INDEPENDENT_AMBULATORY_CARE_PROVIDER_SITE_OTHER): Payer: Medicaid Other | Admitting: Women's Health

## 2022-08-02 ENCOUNTER — Encounter: Payer: Self-pay | Admitting: Women's Health

## 2022-08-02 VITALS — BP 128/89 | HR 104

## 2022-08-02 DIAGNOSIS — Z3483 Encounter for supervision of other normal pregnancy, third trimester: Secondary | ICD-10-CM

## 2022-08-02 DIAGNOSIS — Z3A34 34 weeks gestation of pregnancy: Secondary | ICD-10-CM

## 2022-08-02 NOTE — Patient Instructions (Signed)
Kelsey Lambert, thank you for choosing our office today! We appreciate the opportunity to meet your healthcare needs. You may receive a short survey by mail, e-mail, or through [employer]. If you are happy with your care we would appreciate if you could take just a few minutes to complete the survey questions. We read all of your comments and take your feedback very seriously. Thank you again for choosing our office.  [facility] [employer] [facility] Team at [place] at [facility] ([place], [place] [address]) Entrance C, located off of [place] parking   CLASSES: Go to [email or web address] to register for classes (childbirth, breastfeeding, waterbirth, infant CPR, daddy bootcamp, etc.)  Call the office [phone]) or go to [facility] if: You begin to have strong, frequent contractions Your water breaks.  Sometimes it is a big gush of fluid, sometimes it is just a trickle that keeps getting your panties wet or running down your legs You have vaginal bleeding.  It is normal to have a small amount of spotting if your cervix was checked.  You don't feel your baby moving like normal.  If you don't, get you something to eat and drink and lay down and focus on feeling your baby move.   If your baby is still not moving like normal, you should call the office or go to [facility].  Call the office [phone]) or go to [facility] hospital for these signs of pre-eclampsia: Severe headache that does not go away with Tylenol Visual changes- seeing spots, double, blurred vision Pain under your right breast or upper abdomen that does not go away with Tums or heartburn medicine Nausea and/or vomiting Severe swelling in your hands, feet, and face   Tdap Vaccine It is recommended that you get the Tdap vaccine during the third trimester of EACH pregnancy to help protect your baby from getting pertussis (whooping cough) 27-36 weeks is the BEST time to do this  so that you can pass the protection on to your baby. During pregnancy is better than after pregnancy, but if you are unable to get it during pregnancy it will be offered at the hospital.  You can get this vaccine with [place], at the health department, your family doctor, or some local pharmacies Everyone who will be around your baby should also be up-to-date on their vaccines before the baby comes. Adults (who are not pregnant) only need 1 dose of Tdap during adulthood.   [facility] Pediatricians/Family Doctors [facility]): [address] [place], [facility]: [address] [place], [phone]                [facility]): [place], [phone] (call to ask if accepting patients) [facility] Department: [address], [place], [place] Pediatricians/Family Doctors [facility]): [place]. [place], [address], [facility]: [address] [place], [place] [facility]: [place], [place]): [phone] [facility]: [address] [place]: [address], [facility]: [phone], [phone]  Home Blood Pressure Monitoring for Patients   Your provider has recommended that you check your  blood pressure (BP) at least once a week at home. If you do not have a blood pressure cuff at home, one will be provided for you. Contact your provider if you have not received your monitor within 1 week.   Helpful Tips for Accurate Home Blood Pressure Checks  Don't smoke, exercise, or drink caffeine 30 minutes before checking your BP Use the restroom before checking your BP (a full bladder can raise your  pressure) Relax in a comfortable upright chair Feet on the ground Left arm resting comfortably on a flat surface at the level of your heart Legs uncrossed Back supported Sit quietly and don't talk Place the cuff on your bare arm Adjust snuggly, so that only two fingertips can fit between your skin and the top of the cuff Check 2 readings separated by at least one minute Keep a log of your BP readings For a visual, please reference this diagram: http://ccnc.care/bpdiagram  Provider Name: [facility]     Phone: [phone]  Zone 1: ALL CLEAR  Continue to monitor your symptoms:  BP reading is less than 140 (top number) or less than 90 (bottom number)  No right upper stomach pain No headaches or seeing spots No feeling nauseated or throwing up No swelling in face and hands  Zone 2: CAUTION Call your doctor's office for any of the following:  BP reading is greater than 140 (top number) or greater than 90 (bottom number)  Stomach pain under your ribs in the middle or right side Headaches or seeing spots Feeling nauseated or throwing up Swelling in face and hands  Zone 3: EMERGENCY  Seek immediate medical care if you have any of the following:  BP reading is greater than160 (top number) or greater than 110 (bottom number) Severe headaches not improving with Tylenol Serious difficulty catching your breath Any worsening symptoms from Zone 2  Preterm Labor and Birth Information  The normal length of a pregnancy is 39-41 weeks. Preterm labor is when labor starts before 37 completed weeks of pregnancy. What are the risk factors for preterm labor? Preterm labor is more likely to occur in women who: Have certain infections during pregnancy such as a bladder infection, sexually transmitted infection, or infection inside the uterus (chorioamnionitis). Have a shorter-than-normal cervix. Have gone into preterm labor before. Have had surgery on their cervix. Are younger than age 17  or older than age 35. Are African American. Are pregnant with twins or multiple babies (multiple gestation). Take street drugs or smoke while pregnant. Do not gain enough weight while pregnant. Became pregnant shortly after having been pregnant. What are the symptoms of preterm labor? Symptoms of preterm labor include: Cramps similar to those that can happen during a menstrual period. The cramps may happen with diarrhea. Pain in the abdomen or lower back. Regular uterine contractions that may feel like tightening of the abdomen. A feeling of increased pressure in the pelvis. Increased watery or bloody mucus discharge from the vagina. Water breaking (ruptured amniotic sac). Why is it important to recognize signs of preterm labor? It is important to recognize signs of preterm labor because babies who are born prematurely may not be fully developed. This can put them at an increased risk for: Long-term (chronic) heart and lung problems. Difficulty immediately after birth with regulating body systems, including blood sugar, body temperature, heart rate, and breathing rate. Bleeding in the brain. Cerebral palsy. Learning difficulties. Death. These risks are highest for babies who are born before 34 weeks   of pregnancy. How is preterm labor treated? Treatment depends on the length of your pregnancy, your condition, and the health of your baby. It may involve: Having a stitch (suture) placed in your cervix to prevent your cervix from opening too early (cerclage). Taking or being given medicines, such as: Hormone medicines. These may be given early in pregnancy to help support the pregnancy. Medicine to stop contractions. Medicines to help mature the baby's lungs. These may be prescribed if the risk of delivery is high. Medicines to prevent your baby from developing cerebral palsy. If the labor happens before 34 weeks of pregnancy, you may need to stay in the hospital. What should I do if I  think I am in preterm labor? If you think that you are going into preterm labor, call your health care provider right away. How can I prevent preterm labor in future pregnancies? To increase your chance of having a full-term pregnancy: Do not use any tobacco products, such as cigarettes, chewing tobacco, and e-cigarettes. If you need help quitting, ask your health care provider. Do not use street drugs or medicines that have not been prescribed to you during your pregnancy. Talk with your health care provider before taking any herbal supplements, even if you have been taking them regularly. Make sure you gain a healthy amount of weight during your pregnancy. Watch for infection. If you think that you might have an infection, get it checked right away. Make sure to tell your health care provider if you have gone into preterm labor before. This information is not intended to replace advice given to you by your health care provider. Make sure you discuss any questions you have with your health care provider. Document Revised: 02/15/2019 Document Reviewed: 03/16/2016 Elsevier Patient Education  2020 Elsevier Inc.   

## 2022-08-02 NOTE — Progress Notes (Signed)
LOW-RISK PREGNANCY VISIT Patient name: Kelsey Lambert MRN [id number]  Date of birth: [birth year] Chief Complaint:   Routine Prenatal Visit  History of Present Illness:   Kelsey Lambert is a 26 y.o. G92P1001 female at [redacted]w[redacted]d with an Estimated Date of Delivery: 09/11/22 being seen today for ongoing management of a low-risk pregnancy.   Today she reports no complaints. Checks bps bid at home, all wnl. Contractions: Not present. Vag. Bleeding: None.  Movement: Present. denies leaking of fluid.     06/21/2022    9:18 AM 03/18/2022    9:57 AM 02/10/2020    9:23 AM 01/10/2020   10:00 AM 01/10/2020    9:47 AM  Depression screen PHQ 2/9  Decreased Interest 2 0 1 2 3   Down, Depressed, Hopeless 2 0 0 0 0  PHQ - 2 Score 4 0 1 2 3   Altered sleeping 2 1 2 2 1   Tired, decreased energy 2 1 2 1  0  Change in appetite 0 1 2 2 1   Feeling bad or failure about yourself  2 0 0 0 0  Trouble concentrating 2 0 2 0 0  Moving slowly or fidgety/restless 0 0 0 0 0  Suicidal thoughts 0 0 0 0 0  PHQ-9 Score 12 3 9 7 5   Difficult doing work/chores     Not difficult at all        06/21/2022    9:18 AM 03/18/2022    9:57 AM 02/10/2020    9:23 AM  GAD 7 : Generalized Anxiety Score  Nervous, Anxious, on Edge 2 2 1   Control/stop worrying 2 0 1  Worry too much - different things 2 2 1   Trouble relaxing 2 1 1   Restless 2 1 1   Easily annoyed or irritable 2 1 1   Afraid - awful might happen 2 0 1  Total GAD 7 Score 14 7 7       Review of Systems:   Pertinent items are noted in HPI Denies abnormal vaginal discharge w/ itching/odor/irritation, headaches, visual changes, shortness of breath, chest pain, abdominal pain, severe nausea/vomiting, or problems with urination or bowel movements unless otherwise stated above. Pertinent History Reviewed:  Reviewed past medical,surgical, social, obstetrical and family history.  Reviewed problem list, medications and allergies. Physical Assessment:   Vitals:   08/02/22 1328   BP: 128/89  Pulse: (!) 104  There is no height or weight on file to calculate BMI.        Physical Examination:   General appearance: Well appearing, and in no distress  Mental status: Alert, oriented to person, place, and time  Skin: Warm & dry  Cardiovascular: Normal heart rate noted  Respiratory: Normal respiratory effort, no distress  Abdomen: Soft, gravid, nontender  Pelvic: Cervical exam deferred         Extremities: Edema: None  Fetal Status: Fetal Heart Rate (bpm): 142 Fundal Height: 34 cm Movement: Present    Chaperone: N/A   No results found for this or any previous visit (from the past 24 hour(s)).  Assessment & Plan:  1) Low-risk pregnancy G2P1001 at [redacted]w[redacted]d with an Estimated Date of Delivery: 09/11/22   2) H/O severe pre-e, continue checking bp bid at home, if >140/90 or pre-e s/s, let us know/seek care   Meds: No orders of the defined types were placed in this encounter.  Labs/procedures today: none  Plan:  Continue routine obstetrical care  Next visit: prefers will be in person for cultures  Reviewed: Preterm labor symptoms and general obstetric precautions including but not limited to vaginal bleeding, contractions, leaking of fluid and fetal movement were reviewed in detail with the patient.  All questions were answered. Does have home bp cuff. Office bp cuff given: not applicable. Check bp twice daily, let us know if consistently >140 and/or >90.  Follow-up: Return in about 2 weeks (around 08/16/2022) for LROB, CNM, in person; then weekly.  No future appointments.  No orders of the defined types were placed in this encounter.  Roma Schanz CNM, [facility] 08/02/2022 1:40 PM

## 2022-08-16 ENCOUNTER — Encounter: Payer: Self-pay | Admitting: Women's Health

## 2022-08-16 ENCOUNTER — Other Ambulatory Visit (HOSPITAL_COMMUNITY)
Admission: RE | Admit: 2022-08-16 | Discharge: 2022-08-16 | Disposition: A | Discharge status: Home / Self Care | Payer: Medicaid Other | Source: Ambulatory Visit | Attending: Women's Health | Admitting: Women's Health

## 2022-08-16 ENCOUNTER — Ambulatory Visit (INDEPENDENT_AMBULATORY_CARE_PROVIDER_SITE_OTHER): Payer: Medicaid Other | Admitting: Women's Health

## 2022-08-16 VITALS — BP 128/88 | HR 98 | Wt 187.0 lb

## 2022-08-16 DIAGNOSIS — Z3483 Encounter for supervision of other normal pregnancy, third trimester: Secondary | ICD-10-CM | POA: Diagnosis present

## 2022-08-16 DIAGNOSIS — Z3A36 36 weeks gestation of pregnancy: Secondary | ICD-10-CM

## 2022-08-16 NOTE — Progress Notes (Signed)
LOW-RISK PREGNANCY VISIT Patient name: Kelsey Lambert MRN [id number]  Date of birth: 02-06-1996 Chief Complaint:   Routine Prenatal Visit  History of Present Illness:   Kelsey Lambert is a 26 y.o. G54P1001 female at [redacted]w[redacted]d with an Estimated Date of Delivery: 09/11/22 being seen today for ongoing management of a low-risk pregnancy.   Today she reports no complaints. Contractions: Irritability. Vag. Bleeding: None.  Movement: Present. denies leaking of fluid.     06/21/2022    9:18 AM 03/18/2022    9:57 AM 02/10/2020    9:23 AM 01/10/2020   10:00 AM 01/10/2020    9:47 AM  Depression screen PHQ 2/9  Decreased Interest 2 0 1 2 3   Down, Depressed, Hopeless 2 0 0 0 0  PHQ - 2 Score 4 0 1 2 3   Altered sleeping 2 1 2 2 1   Tired, decreased energy 2 1 2 1  0  Change in appetite 0 1 2 2 1   Feeling bad or failure about yourself  2 0 0 0 0  Trouble concentrating 2 0 2 0 0  Moving slowly or fidgety/restless 0 0 0 0 0  Suicidal thoughts 0 0 0 0 0  PHQ-9 Score 12 3 9 7 5   Difficult doing work/chores     Not difficult at all        06/21/2022    9:18 AM 03/18/2022    9:57 AM 02/10/2020    9:23 AM  GAD 7 : Generalized Anxiety Score  Nervous, Anxious, on Edge 2 2 1   Control/stop worrying 2 0 1  Worry too much - different things 2 2 1   Trouble relaxing 2 1 1   Restless 2 1 1   Easily annoyed or irritable 2 1 1   Afraid - awful might happen 2 0 1  Total GAD 7 Score 14 7 7       Review of Systems:   Pertinent items are noted in HPI Denies abnormal vaginal discharge w/ itching/odor/irritation, headaches, visual changes, shortness of breath, chest pain, abdominal pain, severe nausea/vomiting, or problems with urination or bowel movements unless otherwise stated above. Pertinent History Reviewed:  Reviewed past medical,surgical, social, obstetrical and family history.  Reviewed problem list, medications and allergies. Physical Assessment:   Vitals:   08/16/22 1410  BP: 128/88  Pulse: 98   Weight: 187 lb (84.8 kg)  Body mass index is 38.42 kg/m.        Physical Examination:   General appearance: Well appearing, and in no distress  Mental status: Alert, oriented to person, place, and time  Skin: Warm & dry  Cardiovascular: Normal heart rate noted  Respiratory: Normal respiratory effort, no distress  Abdomen: Soft, gravid, nontender  Pelvic: Cervical exam performed  Dilation: Closed Effacement (%): Thick Station: Ballotable  Extremities: Edema: None  Fetal Status: Fetal Heart Rate (bpm): 140 Fundal Height: 36 cm Movement: Present Presentation: Vertex  Chaperone: Celene Squibb   No results found for this or any previous visit (from the past 24 hour(s)).  Assessment & Plan:  1) Low-risk pregnancy G2P1001 at [redacted]w[redacted]d with an Estimated Date of Delivery: 09/11/22   2) H/O severe pre-e, bp good today, checking bid at home, if >140/90 or pre-e s/s, call us/go to [facility]  3) H/O VAVD w/ 20s shoulder dystocia> baby weighed 7lb3oz, will get EFW    Meds: No orders of the defined types were placed in this encounter.  Labs/procedures today: GBS, GC/CT, and SVE  Plan:  Continue routine obstetrical  care  Next visit: prefers in person    Reviewed: Preterm labor symptoms and general obstetric precautions including but not limited to vaginal bleeding, contractions, leaking of fluid and fetal movement were reviewed in detail with the patient.  All questions were answered. Does have home bp cuff. Office bp cuff given: not applicable. Check bp daily, let us know if consistently >140 and/or >90.  Follow-up: Return for weekly, As scheduled, needs efw u/s (Gbso if needed).  Future Appointments  Date Time Provider Department Center  08/23/2022 11:50 AM Cheral Marker, CNM [facility]  08/30/2022 10:50 AM Cheral Marker, CNM [facility]  09/06/2022 10:30 AM Cheral Marker, CNM [facility]  09/13/2022 10:30 AM Cheral Marker, CNM [facility]    Orders Placed This  Encounter  Procedures   Culture, beta strep (group b only)   Cheral Marker CNM, [facility] 08/16/2022 2:25 PM

## 2022-08-16 NOTE — Patient Instructions (Signed)
Kelsey Lambert, thank you for choosing our office today! We appreciate the opportunity to meet your healthcare needs. You may receive a short survey by mail, e-mail, or through MyChart. If you are happy with your care we would appreciate if you could take just a few minutes to complete the survey questions. We read all of your comments and take your feedback very seriously. Thank you again for choosing our office.  [facility]  [facility] ([address] [place], [place] [address]) Entrance C, located off of [address] Free 24/7 valet parking   CLASSES: Go to [email or web address] to register for classes (childbirth, breastfeeding, waterbirth, infant CPR, daddy bootcamp, etc.)  Call the office ([phone]) or go to [facility] if: You begin to have strong, frequent contractions Your water breaks.  Sometimes it is a big gush of fluid, sometimes it is just a trickle that keeps getting your panties wet or running down your legs You have vaginal bleeding.  It is normal to have a small amount of spotting if your cervix was checked.  You don't feel your baby moving like normal.  If you don't, get you something to eat and drink and lay down and focus on feeling your baby move.   If your baby is still not moving like normal, you should call the office or go to [facility].  Call the office ([phone]) or go to [facility] for these signs of pre-eclampsia: Severe headache that does not go away with Tylenol Visual changes- seeing spots, double, blurred vision Pain under your right breast or upper abdomen that does not go away with Tums or heartburn medicine Nausea and/or vomiting Severe swelling in your hands, feet, and face   Tdap Vaccine It is recommended that you get the Tdap vaccine during the third trimester of EACH pregnancy to help protect your baby from getting pertussis (whooping cough) 27-36 weeks is the BEST time to do this  so that you can pass the protection on to your baby. During pregnancy is better than after pregnancy, but if you are unable to get it during pregnancy it will be offered at the hospital.  You can get this vaccine with us, at the health department, your family doctor, or some local pharmacies Everyone who will be around your baby should also be up-to-date on their vaccines before the baby comes. Adults (who are not pregnant) only need 1 dose of Tdap during adulthood.   [place] Pediatricians/Family Doctors [facility] ([facility]): [address], [phone]           [facility]: [address], [phone]                [facility] ([facility]): [address], [phone] (call to ask if accepting patients) [facility]: [address], [place], [phone]    [place] Pediatricians/Family Doctors [facility] ([facility]): [address], [phone] [facility]: [address], [phone] [facility]: [address], [phone]  [place] [facility]  [facility] ([facility]): [phone] [facility]: [address], [phone]   [place] [facility]: [address], [phone]  [place] [facility]  [facility]: [address], [phone]  Home Blood Pressure Monitoring for Patients   Your provider has recommended that you check your   blood pressure (BP) at least once a week at home. If you do not have a blood pressure cuff at home, one will be provided for you. Contact your provider if you have not received your monitor within 1 week.   Helpful Tips for Accurate Home Blood Pressure Checks  Don't smoke, exercise, or drink caffeine 30 minutes before checking your BP Use the restroom before checking your BP (a full bladder can raise your  pressure) Relax in a comfortable upright chair Feet on the ground Left arm resting comfortably on a flat surface at the level of your heart Legs uncrossed Back supported Sit quietly and don't talk Place the cuff on your bare arm Adjust snuggly, so that only two fingertips can fit between your skin and the top of the cuff Check 2 readings separated by at least one minute Keep a log of your BP readings For a visual, please reference this diagram: http://ccnc.care/bpdiagram  Provider Name: [facility]     Phone: [phone]  Zone 1: ALL CLEAR  Continue to monitor your symptoms:  BP reading is less than 140 (top number) or less than 90 (bottom number)  No right upper stomach pain No headaches or seeing spots No feeling nauseated or throwing up No swelling in face and hands  Zone 2: CAUTION Call your doctor's office for any of the following:  BP reading is greater than 140 (top number) or greater than 90 (bottom number)  Stomach pain under your ribs in the middle or right side Headaches or seeing spots Feeling nauseated or throwing up Swelling in face and hands  Zone 3: EMERGENCY  Seek immediate medical care if you have any of the following:  BP reading is greater than160 (top number) or greater than 110 (bottom number) Severe headaches not improving with Tylenol Serious difficulty catching your breath Any worsening symptoms from Zone 2  Preterm Labor and Birth Information  The normal length of a pregnancy is 39-41 weeks. Preterm labor is when labor starts before 37 completed weeks of pregnancy. What are the risk factors for preterm labor? Preterm labor is more likely to occur in women who: Have certain infections during pregnancy such as a bladder infection, sexually transmitted infection, or infection inside the uterus (chorioamnionitis). Have a shorter-than-normal cervix. Have gone into preterm labor before. Have had surgery on their cervix. Are younger than age 17  or older than age 35. Are African American. Are pregnant with twins or multiple babies (multiple gestation). Take street drugs or smoke while pregnant. Do not gain enough weight while pregnant. Became pregnant shortly after having been pregnant. What are the symptoms of preterm labor? Symptoms of preterm labor include: Cramps similar to those that can happen during a menstrual period. The cramps may happen with diarrhea. Pain in the abdomen or lower back. Regular uterine contractions that may feel like tightening of the abdomen. A feeling of increased pressure in the pelvis. Increased watery or bloody mucus discharge from the vagina. Water breaking (ruptured amniotic sac). Why is it important to recognize signs of preterm labor? It is important to recognize signs of preterm labor because babies who are born prematurely may not be fully developed. This can put them at an increased risk for: Long-term (chronic) heart and lung problems. Difficulty immediately after birth with regulating body systems, including blood sugar, body temperature, heart rate, and breathing rate. Bleeding in the brain. Cerebral palsy. Learning difficulties. Death. These risks are highest for babies who are born before 34 weeks   of pregnancy. How is preterm labor treated? Treatment depends on the length of your pregnancy, your condition, and the health of your baby. It may involve: Having a stitch (suture) placed in your cervix to prevent your cervix from opening too early (cerclage). Taking or being given medicines, such as: Hormone medicines. These may be given early in pregnancy to help support the pregnancy. Medicine to stop contractions. Medicines to help mature the baby's lungs. These may be prescribed if the risk of delivery is high. Medicines to prevent your baby from developing cerebral palsy. If the labor happens before 34 weeks of pregnancy, you may need to stay in the hospital. What should I do if I  think I am in preterm labor? If you think that you are going into preterm labor, call your health care provider right away. How can I prevent preterm labor in future pregnancies? To increase your chance of having a full-term pregnancy: Do not use any tobacco products, such as cigarettes, chewing tobacco, and e-cigarettes. If you need help quitting, ask your health care provider. Do not use street drugs or medicines that have not been prescribed to you during your pregnancy. Talk with your health care provider before taking any herbal supplements, even if you have been taking them regularly. Make sure you gain a healthy amount of weight during your pregnancy. Watch for infection. If you think that you might have an infection, get it checked right away. Make sure to tell your health care provider if you have gone into preterm labor before. This information is not intended to replace advice given to you by your health care provider. Make sure you discuss any questions you have with your health care provider. Document Revised: 02/15/2019 Document Reviewed: 03/16/2016 Elsevier Patient Education  2020 [employer]   

## 2022-08-18 ENCOUNTER — Ambulatory Visit: Payer: Medicaid Other | Attending: Women's Health

## 2022-08-18 ENCOUNTER — Encounter: Payer: Self-pay | Admitting: *Deleted

## 2022-08-18 ENCOUNTER — Ambulatory Visit: Payer: Medicaid Other | Admitting: *Deleted

## 2022-08-18 ENCOUNTER — Other Ambulatory Visit: Payer: Self-pay | Admitting: *Deleted

## 2022-08-18 VITALS — BP 130/92 | HR 83

## 2022-08-18 DIAGNOSIS — O09293 Supervision of pregnancy with other poor reproductive or obstetric history, third trimester: Secondary | ICD-10-CM

## 2022-08-18 DIAGNOSIS — Z6791 Unspecified blood type, Rh negative: Secondary | ICD-10-CM | POA: Insufficient documentation

## 2022-08-18 DIAGNOSIS — O99213 Obesity complicating pregnancy, third trimester: Secondary | ICD-10-CM

## 2022-08-18 DIAGNOSIS — O26899 Other specified pregnancy related conditions, unspecified trimester: Secondary | ICD-10-CM | POA: Diagnosis present

## 2022-08-18 DIAGNOSIS — Z3689 Encounter for other specified antenatal screening: Secondary | ICD-10-CM | POA: Insufficient documentation

## 2022-08-18 DIAGNOSIS — Z302 Encounter for sterilization: Secondary | ICD-10-CM | POA: Diagnosis present

## 2022-08-18 LAB — CERVICOVAGINAL ANCILLARY ONLY
Chlamydia: NEGATIVE
Comment: NEGATIVE
Comment: NORMAL
Neisseria Gonorrhea: NEGATIVE

## 2022-08-20 LAB — CULTURE, BETA STREP (GROUP B ONLY): Strep Gp B Culture: NEGATIVE

## 2022-08-23 ENCOUNTER — Ambulatory Visit (INDEPENDENT_AMBULATORY_CARE_PROVIDER_SITE_OTHER): Payer: Medicaid Other | Admitting: Women's Health

## 2022-08-23 ENCOUNTER — Encounter: Payer: Self-pay | Admitting: Women's Health

## 2022-08-23 VITALS — BP 119/81 | HR 93 | Wt 187.0 lb

## 2022-08-23 DIAGNOSIS — Z3483 Encounter for supervision of other normal pregnancy, third trimester: Secondary | ICD-10-CM

## 2022-08-23 DIAGNOSIS — Z3480 Encounter for supervision of other normal pregnancy, unspecified trimester: Secondary | ICD-10-CM

## 2022-08-23 NOTE — Patient Instructions (Signed)
Stanton Kidney, thank you for choosing our office today! We appreciate the opportunity to meet your healthcare needs. You may receive a short survey by mail, e-mail, or through [employer]. If you are happy with your care we would appreciate if you could take just a few minutes to complete the survey questions. We read all of your comments and take your feedback very seriously. Thank you again for choosing our office.  Center for [employer] Team at [place] [facility] ([place], [place] [address]) Entrance C, located off of [place] parking   CLASSES: Go to [email or web address] to register for classes (childbirth, breastfeeding, waterbirth, infant CPR, daddy bootcamp, etc.)  Call the office [phone]) or go to [facility] if: You begin to have strong, frequent contractions Your water breaks.  Sometimes it is a big gush of fluid, sometimes it is just a trickle that keeps getting your panties wet or running down your legs You have vaginal bleeding.  It is normal to have a small amount of spotting if your cervix was checked.  You don't feel your baby moving like normal.  If you don't, get you something to eat and drink and lay down and focus on feeling your baby move.   If your baby is still not moving like normal, you should call the office or go to [facility].  Call the office [phone]) or go to [facility] hospital for these signs of pre-eclampsia: Severe headache that does not go away with Tylenol Visual changes- seeing spots, double, blurred vision Pain under your right breast or upper abdomen that does not go away with Tums or heartburn medicine Nausea and/or vomiting Severe swelling in your hands, feet, and face   [facility] Pediatricians/Family Doctors [facility]): [address] [place], [place]: [address] [place], [place] [facility]): [place], [phone] (call to ask if accepting patients) [facility] Department: [address], [place], [facility]): [address], [address], [facility] [facility]: [address] [place], [place] [facility] [place]: [place], [place] [facility]): [phone] [facility]: [address] [place]: [address], [place] [facility]: [phone], [phone]  Home Blood Pressure Monitoring for Patients   Your provider has recommended that you check your blood pressure (BP) at least once a week at home. If you do not have a blood pressure cuff at home, one will be provided for you. Contact your provider if you have not received your monitor within 1 week.   Helpful Tips for Accurate Home Blood Pressure Checks  Don't smoke, exercise, or drink caffeine 30 minutes before checking your BP Use the restroom before checking your BP (a full bladder can raise your pressure) Relax in a comfortable upright chair Feet on the ground Left arm resting comfortably on a flat surface at the level of your heart Legs uncrossed Back supported Sit quietly and don't talk Place the cuff on your bare arm Adjust snuggly, so that only two fingertips  can fit between your skin and the top of the cuff Check 2 readings separated by at least one minute Keep a log of your BP readings For a visual, please reference this diagram: http://ccnc.care/bpdiagram  Provider Name: [facility]     Phone: [phone]  Zone 1: ALL CLEAR  Continue to monitor your symptoms:  BP reading is less than 140 (top number) or less than 90 (bottom number)  No right  upper stomach pain No headaches or seeing spots No feeling nauseated or throwing up No swelling in face and hands  Zone 2: CAUTION Call your doctor's office for any of the following:  BP reading is greater than 140 (top number) or greater than 90 (bottom number)  Stomach pain under your ribs in the middle or right side Headaches or seeing spots Feeling nauseated or throwing up Swelling in face and hands  Zone 3: EMERGENCY  Seek immediate medical care if you have any of the following:  BP reading is greater than160 (top number) or greater than 110 (bottom number) Severe headaches not improving with Tylenol Serious difficulty catching your breath Any worsening symptoms from Zone 2   Braxton Hicks Contractions Contractions of the uterus can occur throughout pregnancy, but they are not always a sign that you are in labor. You may have practice contractions called Braxton Hicks contractions. These false labor contractions are sometimes confused with true labor. What are [place] contractions? Braxton Hicks contractions are tightening movements that occur in the muscles of the uterus before labor. Unlike true labor contractions, these contractions do not result in opening (dilation) and thinning of the cervix. Toward the end of pregnancy (32-34 weeks), Braxton Hicks contractions can happen more often and may become stronger. These contractions are sometimes difficult to tell apart from true labor because they can be very uncomfortable. You should not feel embarrassed if you go to the hospital with false labor. Sometimes, the only way to tell if you are in true labor is for your health care provider to look for changes in the cervix. The health care provider will do a physical exam and may monitor your contractions. If you are not in true labor, the exam should show that your cervix is not dilating and your water has not broken. If there are no other health problems associated with your  pregnancy, it is completely safe for you to be sent home with false labor. You may continue to have Braxton Hicks contractions until you go into true labor. How to tell the difference between true labor and false labor True labor Contractions last 30-70 seconds. Contractions become very regular. Discomfort is usually felt in the top of the uterus, and it spreads to the lower abdomen and low back. Contractions do not go away with walking. Contractions usually become more intense and increase in frequency. The cervix dilates and gets thinner. False labor Contractions are usually shorter and not as strong as true labor contractions. Contractions are usually irregular. Contractions are often felt in the front of the lower abdomen and in the groin. Contractions may go away when you walk around or change positions while lying down. Contractions get weaker and are shorter-lasting as time goes on. The cervix usually does not dilate or become thin. Follow these instructions at home:  Take over-the-counter and prescription medicines only as told by your health care provider. Keep up with your usual exercises and follow other instructions from your health care provider. Eat and drink lightly if you think  you are going into labor. If Braxton Hicks contractions are making you uncomfortable: Change your position from lying down or resting to walking, or change from walking to resting. Sit and rest in a tub of warm water. Drink enough fluid to keep your urine pale yellow. Dehydration may cause these contractions. Do slow and deep breathing several times an hour. Keep all follow-up prenatal visits as told by your health care provider. This is important. Contact a health care provider if: You have a fever. You have continuous pain in your abdomen. Get help right away if: Your contractions become stronger, more regular, and closer together. You have fluid leaking or gushing from your vagina. You pass  blood-tinged mucus (bloody show). You have bleeding from your vagina. You have low back pain that you never had before. You feel your baby's head pushing down and causing pelvic pressure. Your baby is not moving inside you as much as it used to. Summary Contractions that occur before labor are called Braxton Hicks contractions, false labor, or practice contractions. Braxton Hicks contractions are usually shorter, weaker, farther apart, and less regular than true labor contractions. True labor contractions usually become progressively stronger and regular, and they become more frequent. Manage discomfort from [facility] contractions by changing position, resting in a warm bath, drinking plenty of water, or practicing deep breathing. This information is not intended to replace advice given to you by your health care provider. Make sure you discuss any questions you have with your health care provider. Document Revised: 10/06/2017 Document Reviewed: 03/09/2017 Elsevier Patient Education  [place].

## 2022-08-23 NOTE — Progress Notes (Signed)
LOW-RISK PREGNANCY VISIT Patient name: Kelsey Lambert MRN [id number]  Date of birth: 1996-07-12 Chief Complaint:   Routine Prenatal Visit  History of Present Illness:   Kelsey Lambert is a 26 y.o. G71P1001 female at [redacted]w[redacted]d with an Estimated Date of Delivery: 09/11/22 being seen today for ongoing management of a low-risk pregnancy.   Today she reports no complaints. Contractions: Not present.  .  Movement: Present. denies leaking of fluid.     08/23/2022   12:10 PM 06/21/2022    9:18 AM 03/18/2022    9:57 AM 02/10/2020    9:23 AM 01/10/2020   10:00 AM  Depression screen PHQ 2/9  Decreased Interest 1 2 0 1 2  Down, Depressed, Hopeless 0 2 0 0 0  PHQ - 2 Score 1 4 0 1 2  Altered sleeping 3 2 1 2 2   Tired, decreased energy 2 2 1 2 1   Change in appetite 0 0 1 2 2   Feeling bad or failure about yourself  0 2 0 0 0  Trouble concentrating 0 2 0 2 0  Moving slowly or fidgety/restless 0 0 0 0 0  Suicidal thoughts 0 0 0 0 0  PHQ-9 Score 6 12 3 9 7         08/23/2022   12:11 PM 06/21/2022    9:18 AM 03/18/2022    9:57 AM 02/10/2020    9:23 AM  GAD 7 : Generalized Anxiety Score  Nervous, Anxious, on Edge 1 2 2 1   Control/stop worrying 1 2 0 1  Worry too much - different things 1 2 2 1   Trouble relaxing 1 2 1 1   Restless 2 2 1 1   Easily annoyed or irritable 1 2 1 1   Afraid - awful might happen 2 2 0 1  Total GAD 7 Score 9 14 7 7       Review of Systems:   Pertinent items are noted in HPI Denies abnormal vaginal discharge w/ itching/odor/irritation, headaches, visual changes, shortness of breath, chest pain, abdominal pain, severe nausea/vomiting, or problems with urination or bowel movements unless otherwise stated above. Pertinent History Reviewed:  Reviewed past medical,surgical, social, obstetrical and family history.  Reviewed problem list, medications and allergies. Physical Assessment:   Vitals:   08/23/22 1205  BP: 119/81  Pulse: 93  Weight: 187 lb (84.8 kg)  Body mass  index is 38.42 kg/m.        Physical Examination:   General appearance: Well appearing, and in no distress  Mental status: Alert, oriented to person, place, and time  Skin: Warm & dry  Cardiovascular: Normal heart rate noted  Respiratory: Normal respiratory effort, no distress  Abdomen: Soft, gravid, nontender  Pelvic: Cervical exam deferred         Extremities: Edema: None  Fetal Status: Fetal Heart Rate (bpm): 145 Fundal Height: 36 cm Movement: Present    Chaperone: N/A   No results found for this or any previous visit (from the past 24 hour(s)).  Assessment & Plan:  1) Low-risk pregnancy G2P1001 at [redacted]w[redacted]d with an Estimated Date of Delivery: 09/11/22   2) H/O VAVD w/ 20s SD, 7lb3.7oz, EFW 27%/5lb15oz @ 36.4wk this pregnancy  3) H/O severe pre-e> bp good, reviewed pre-e s/s, reasons to seek care    Meds: No orders of the defined types were placed in this encounter.  Labs/procedures today: none  Plan:  Continue routine obstetrical care  Next visit: prefers in person    Reviewed: Term  labor symptoms and general obstetric precautions including but not limited to vaginal bleeding, contractions, leaking of fluid and fetal movement were reviewed in detail with the patient.  All questions were answered. Does have home bp cuff. Office bp cuff given: not applicable. Check bp daily, let us know if consistently >140 and/or >90.  Follow-up: Return for weekly, As scheduled.  Future Appointments  Date Time Provider [place]  08/30/2022 10:50 AM Roma Schanz, CNM [facility]  09/06/2022 10:30 AM Roma Schanz, CNM [facility]  09/13/2022 10:30 AM Roma Schanz, CNM [facility]    No orders of the defined types were placed in this encounter.  [place], [facility] 08/23/2022 12:28 PM

## 2022-08-30 ENCOUNTER — Encounter (HOSPITAL_COMMUNITY): Payer: Self-pay | Admitting: Obstetrics and Gynecology

## 2022-08-30 ENCOUNTER — Inpatient Hospital Stay (HOSPITAL_COMMUNITY): Payer: Medicaid Other | Admitting: Anesthesiology

## 2022-08-30 ENCOUNTER — Encounter: Payer: Self-pay | Admitting: Women's Health

## 2022-08-30 ENCOUNTER — Ambulatory Visit (INDEPENDENT_AMBULATORY_CARE_PROVIDER_SITE_OTHER): Payer: Medicaid Other | Admitting: Women's Health

## 2022-08-30 ENCOUNTER — Other Ambulatory Visit: Payer: Self-pay

## 2022-08-30 ENCOUNTER — Inpatient Hospital Stay (HOSPITAL_COMMUNITY)
Admission: AD | Admit: 2022-08-30 | Discharge: 2022-09-01 | DRG: 797 | Disposition: A | Discharge status: Home / Self Care | Payer: Medicaid Other | Attending: Obstetrics and Gynecology | Admitting: Obstetrics and Gynecology

## 2022-08-30 VITALS — BP 131/95 | HR 80 | Wt 180.4 lb

## 2022-08-30 DIAGNOSIS — B3731 Acute candidiasis of vulva and vagina: Secondary | ICD-10-CM | POA: Diagnosis present

## 2022-08-30 DIAGNOSIS — Z3A38 38 weeks gestation of pregnancy: Secondary | ICD-10-CM

## 2022-08-30 DIAGNOSIS — Z7982 Long term (current) use of aspirin: Secondary | ICD-10-CM | POA: Diagnosis not present

## 2022-08-30 DIAGNOSIS — Z8759 Personal history of other complications of pregnancy, childbirth and the puerperium: Secondary | ICD-10-CM | POA: Insufficient documentation

## 2022-08-30 DIAGNOSIS — Z6791 Unspecified blood type, Rh negative: Secondary | ICD-10-CM

## 2022-08-30 DIAGNOSIS — O9882 Other maternal infectious and parasitic diseases complicating childbirth: Secondary | ICD-10-CM | POA: Diagnosis present

## 2022-08-30 DIAGNOSIS — O99214 Obesity complicating childbirth: Secondary | ICD-10-CM | POA: Diagnosis present

## 2022-08-30 DIAGNOSIS — O134 Gestational [pregnancy-induced] hypertension without significant proteinuria, complicating childbirth: Principal | ICD-10-CM | POA: Diagnosis present

## 2022-08-30 DIAGNOSIS — N390 Urinary tract infection, site not specified: Secondary | ICD-10-CM | POA: Diagnosis present

## 2022-08-30 DIAGNOSIS — O26893 Other specified pregnancy related conditions, third trimester: Secondary | ICD-10-CM | POA: Diagnosis present

## 2022-08-30 DIAGNOSIS — Z302 Encounter for sterilization: Secondary | ICD-10-CM | POA: Diagnosis not present

## 2022-08-30 DIAGNOSIS — I10 Essential (primary) hypertension: Secondary | ICD-10-CM | POA: Diagnosis not present

## 2022-08-30 DIAGNOSIS — O99324 Drug use complicating childbirth: Secondary | ICD-10-CM | POA: Diagnosis present

## 2022-08-30 DIAGNOSIS — R300 Dysuria: Secondary | ICD-10-CM

## 2022-08-30 DIAGNOSIS — O133 Gestational [pregnancy-induced] hypertension without significant proteinuria, third trimester: Secondary | ICD-10-CM

## 2022-08-30 DIAGNOSIS — J45909 Unspecified asthma, uncomplicated: Secondary | ICD-10-CM | POA: Diagnosis not present

## 2022-08-30 DIAGNOSIS — O0993 Supervision of high risk pregnancy, unspecified, third trimester: Secondary | ICD-10-CM

## 2022-08-30 DIAGNOSIS — F1210 Cannabis abuse, uncomplicated: Secondary | ICD-10-CM | POA: Diagnosis present

## 2022-08-30 DIAGNOSIS — Z87891 Personal history of nicotine dependence: Secondary | ICD-10-CM

## 2022-08-30 DIAGNOSIS — Z9079 Acquired absence of other genital organ(s): Secondary | ICD-10-CM

## 2022-08-30 LAB — CBC
HCT: 34.1 % — ABNORMAL LOW (ref 36.0–46.0)
Hemoglobin: 11.6 g/dL — ABNORMAL LOW (ref 12.0–15.0)
MCH: 27.7 pg (ref 26.0–34.0)
MCHC: 34 g/dL (ref 30.0–36.0)
MCV: 81.4 fL (ref 80.0–100.0)
Platelets: 251 10*3/uL (ref 150–400)
RBC: 4.19 MIL/uL (ref 3.87–5.11)
RDW: 14.7 % (ref 11.5–15.5)
WBC: 14.9 10*3/uL — ABNORMAL HIGH (ref 4.0–10.5)
nRBC: 0 % (ref 0.0–0.2)

## 2022-08-30 LAB — COMPREHENSIVE METABOLIC PANEL
ALT: 36 U/L (ref 0–44)
AST: 36 U/L (ref 15–41)
Albumin: 3 g/dL — ABNORMAL LOW (ref 3.5–5.0)
Alkaline Phosphatase: 236 U/L — ABNORMAL HIGH (ref 38–126)
Anion gap: 12 (ref 5–15)
BUN: 7 mg/dL (ref 6–20)
CO2: 18 mmol/L — ABNORMAL LOW (ref 22–32)
Calcium: 9 mg/dL (ref 8.9–10.3)
Chloride: 107 mmol/L (ref 98–111)
Creatinine, Ser: 0.45 mg/dL (ref 0.44–1.00)
GFR, Estimated: >60 mL/min (ref >60)
Glucose, Bld: 79 mg/dL (ref 70–99)
Potassium: 3.5 mmol/L (ref 3.5–5.1)
Sodium: 137 mmol/L (ref 135–145)
Total Bilirubin: 0.4 mg/dL (ref 0.3–1.2)
Total Protein: 6.6 g/dL (ref 6.5–8.1)

## 2022-08-30 LAB — PROTEIN / CREATININE RATIO, URINE
Creatinine, Urine: 144 mg/dL
Protein Creatinine Ratio: 0.19 mg/mg{Cre} — ABNORMAL HIGH (ref 0.00–0.15)
Total Protein, Urine: 28 mg/dL

## 2022-08-30 LAB — POCT URINALYSIS DIPSTICK OB
Glucose, UA: NEGATIVE
Ketones, UA: POSITIVE
Nitrite, UA: NEGATIVE
POC,PROTEIN,UA: NEGATIVE

## 2022-08-30 MED ORDER — PHENYLEPHRINE 80 MCG/ML (10ML) SYRINGE FOR IV PUSH (FOR BLOOD PRESSURE SUPPORT): 80.0000 ug | PREFILLED_SYRINGE | INTRAVENOUS | Status: DC | PRN

## 2022-08-30 MED ORDER — FENTANYL-BUPIVACAINE-NACL 0.5-0.125-0.9 MG/250ML-% EP SOLN
12.0000 mL/h | EPIDURAL | Status: DC | PRN
  Filled 2022-08-30: qty 250

## 2022-08-30 MED ORDER — OXYCODONE-ACETAMINOPHEN 5-325 MG PO TABS: 2.0000 | ORAL_TABLET | ORAL | Status: DC | PRN

## 2022-08-30 MED ORDER — FENTANYL-BUPIVACAINE-NACL 0.5-0.125-0.9 MG/250ML-% EP SOLN
EPIDURAL | Status: DC | PRN
  Administered 2022-08-30: 12 mL/h via EPIDURAL

## 2022-08-30 MED ORDER — LIDOCAINE HCL (PF) 1 % IJ SOLN
INTRAMUSCULAR | Status: DC | PRN
  Administered 2022-08-30: 10 mL via EPIDURAL
  Administered 2022-08-30: 2 mL via EPIDURAL

## 2022-08-30 MED ORDER — OXYTOCIN-SODIUM CHLORIDE 30-0.9 UT/500ML-% IV SOLN
1.0000 m[IU]/min | INTRAVENOUS | Status: DC
  Administered 2022-08-30: 2 m[IU]/min via INTRAVENOUS
  Filled 2022-08-30: qty 500

## 2022-08-30 MED ORDER — SODIUM CHLORIDE 0.9 % IV SOLN
1.0000 g | INTRAVENOUS | Status: DC
  Administered 2022-08-30: 1 g via INTRAVENOUS
  Filled 2022-08-30: qty 10

## 2022-08-30 MED ORDER — TERBUTALINE SULFATE 1 MG/ML IJ SOLN: 0.2500 mg | Freq: Once | INTRAMUSCULAR | Status: DC | PRN

## 2022-08-30 MED ORDER — EPHEDRINE 5 MG/ML INJ: 10.0000 mg | INTRAVENOUS | Status: DC | PRN

## 2022-08-30 MED ORDER — SOD CITRATE-CITRIC ACID 500-334 MG/5ML PO SOLN: 30.0000 mL | ORAL | Status: DC | PRN

## 2022-08-30 MED ORDER — MISOPROSTOL 50MCG HALF TABLET: 50.0000 ug | ORAL_TABLET | Freq: Once | ORAL | Status: DC

## 2022-08-30 MED ORDER — LACTATED RINGERS IV SOLN: INTRAVENOUS | Status: DC

## 2022-08-30 MED ORDER — ACETAMINOPHEN 325 MG PO TABS: 650.0000 mg | ORAL_TABLET | ORAL | Status: DC | PRN

## 2022-08-30 MED ORDER — OXYTOCIN BOLUS FROM INFUSION
333.0000 mL | Freq: Once | INTRAVENOUS | Status: AC
  Administered 2022-08-31: 333 mL via INTRAVENOUS

## 2022-08-30 MED ORDER — FLEET ENEMA 7-19 GM/118ML RE ENEM: 1.0000 | ENEMA | RECTAL | Status: DC | PRN

## 2022-08-30 MED ORDER — OXYTOCIN-SODIUM CHLORIDE 30-0.9 UT/500ML-% IV SOLN
2.5000 [IU]/h | INTRAVENOUS | Status: DC
  Administered 2022-08-31: 2.5 [IU]/h via INTRAVENOUS

## 2022-08-30 MED ORDER — DIPHENHYDRAMINE HCL 50 MG/ML IJ SOLN: 12.5000 mg | INTRAMUSCULAR | Status: DC | PRN

## 2022-08-30 MED ORDER — LIDOCAINE HCL (PF) 1 % IJ SOLN: 30.0000 mL | INTRAMUSCULAR | Status: DC | PRN

## 2022-08-30 MED ORDER — ONDANSETRON HCL 4 MG/2ML IJ SOLN
4.0000 mg | Freq: Four times a day (QID) | INTRAMUSCULAR | Status: DC | PRN
  Administered 2022-08-30: 4 mg via INTRAVENOUS
  Filled 2022-08-30: qty 2

## 2022-08-30 MED ORDER — OXYCODONE-ACETAMINOPHEN 5-325 MG PO TABS: 1.0000 | ORAL_TABLET | ORAL | Status: DC | PRN

## 2022-08-30 MED ORDER — MISOPROSTOL 25 MCG QUARTER TABLET: 25.0000 ug | ORAL_TABLET | Freq: Once | ORAL | Status: DC

## 2022-08-30 MED ORDER — LACTATED RINGERS IV SOLN: 500.0000 mL | INTRAVENOUS | Status: DC | PRN

## 2022-08-30 MED ORDER — LACTATED RINGERS IV SOLN: 500.0000 mL | Freq: Once | INTRAVENOUS | Status: DC

## 2022-08-30 NOTE — H&P (Cosign Needed Addendum)
OBSTETRIC ADMISSION HISTORY AND PHYSICAL  Kelsey Lambert is a 26 y.o. female G2P1001 with IUP at [redacted]w[redacted]d by LMP/ 13 wk [place] presenting for IOL for new gHTN, hx of severe pre-E in prior pregnancy. She reports +FMs, No LOF, no VB, no blurry vision, headaches or peripheral edema, and RUQ pain.  She plans on bottle feeding. She requests post partum BTL for birth control. She received her prenatal [facility]   Concern for both UTI (frequency and dysuria, LE and blood on UA) as well as thick yellow vaginal discharge on exam at prenatal visit.   BP: 131/95 at clinic appt today.   Dating: By LMP and 13 wk [place] --->  Estimated Date of Delivery: 09/11/22  Sono:    @[redacted]w[redacted]d , CWD, normal anatomy, cephalic presentation, 2703g, 06-15-2001 EFW   Prenatal History/Complications:  gHTN- new diagnosis  Rh-, got rhogam 07/05/22 Marijuana substance abuse (admits to smoking throughout her entire pregnancy)   Past Medical History: Past Medical History:  Diagnosis Date   Asthma    as a child   Pregnancy induced hypertension     Past Surgical History: Past Surgical History:  Procedure Laterality Date   WISDOM TOOTH EXTRACTION      Obstetrical History: OB History     Gravida  2   Para  1   Term  1   Preterm      AB      Living  1      SAB      IAB      Ectopic      Multiple  0   Live Births  1           Social History Social History   Socioeconomic History   Marital status: Significant Other    Spouse name: Not on file   Number of children: Not on file   Years of education: Not on file   Highest education level: Not on file  Occupational History   Not on file  Tobacco Use   Smoking status: Former   Smokeless tobacco: Never  Vaping Use   Vaping Use: Never used  Substance and Sexual Activity   Alcohol use: Not Currently   Drug use: Yes    Types: Marijuana    Comment: occasional   Sexual activity: Yes    Birth control/protection: None  Other Topics Concern    Not on file  Social History Narrative   Not on file   Social Determinants of Health   Financial Resource Strain: Low Risk  (06/21/2022)   Overall Financial Resource Strain (CARDIA)    Difficulty of Paying Living Expenses: Not hard at all  Food Insecurity: No Food Insecurity (08/30/2022)   Hunger Vital Sign    Worried About Running Out of Food in the Last Year: Never true    Ran Out of Food in the Last Year: Never true  Transportation Needs: Unmet Transportation Needs (08/30/2022)   PRAPARE - 09/01/2022 (Medical): Yes    Lack of Transportation (Non-Medical): No  Physical Activity: Insufficiently Active (06/21/2022)   Exercise Vital Sign    Days of Exercise per Week: 3 days    Minutes of Exercise per Session: 10 min  Stress: Stress Concern Present (06/21/2022)   06/23/2022 of Occupational Health - Occupational Stress Questionnaire    Feeling of Stress : Very much  Social Connections: Socially Isolated (06/21/2022)   Social Connection and Isolation Panel [NHANES]  Frequency of Communication with Friends and Family: Once a week    Frequency of Social Gatherings with Friends and Family: Once a week    Attends Religious Services: Never    Marine scientist or Organizations: No    Attends Music therapist: Never    Marital Status: Never married    Family History: Family History  Problem Relation Age of Onset   Diabetes Mother    COPD Mother    Cancer Maternal Grandmother        lung   Cancer Maternal Uncle     Allergies: No Known Allergies  Medications Prior to Admission  Medication Sig Dispense Refill Last Dose   aspirin 81 MG chewable tablet Chew 81 mg by mouth daily.   08/30/2022 at 0800   Prenatal Vit-Fe Fumarate-FA (PRENATAL MULTIVITAMIN) TABS tablet Take 1 tablet by mouth daily at 12 noon.   Past Week   Blood Pressure Monitor MISC For regular home bp monitoring during pregnancy 1 each 0      Review of Systems    All systems reviewed and negative except as stated in HPI  Temperature 97.9 F (36.6 C), temperature source Oral, height 4' 10.5" (1.486 m), weight 81.6 kg, last menstrual period 12/05/2021. General appearance: alert, cooperative, and no distress Lungs: normal work of breathing, no respiratory distress Heart: normal/regular rate Abdomen: soft, non-tender Pelvic: thick white discharge visible, normal external genitalia. 1.5 cm/thick/-2.  Extremities:  no sign of DVT Presentation: cephalic Fetal monitoringBaseline: 150 bpm, Variability: Fair (1-6 bpm), Accelerations: Reactive, and Decelerations: Absent Uterine activity: irritability     Prenatal labs: ABO, Rh: A/Negative/-- (05/12 1038) Antibody: Negative (08/15 0814) Rubella: 1.08 (05/12 1038) RPR: Non Reactive (08/15 0814)  HBsAg: Negative (05/12 1038)  HIV: Non Reactive (08/15 0814)  GBS: Negative/-- (10/10 1430)  1 hr Glucola - normal A1c Genetic screening-  declined Anatomy US normal    [facility]  RESULTS  Language English Pap 01/10/20 neg  Initiated care at 14wks GC/CT Initial:   -/-         36wks:  -/-  Dating by LMP c/w 13wk [place]    Support person  Genetics NT/IT:declined   MPN:[id number]      Panorama: declined  BP cuff Has bp cuff Carrier Screen declined    [place]/Hgb Elec 01/01/20 neg  Rhogam 07/05/22    TDaP vaccine 06/21/22  Blood Type A/Negative/-- (05/12 1038)  Flu vaccine  Antibody Negative (05/12 1038)  Covid vaccine  HBsAg Negative (05/12 1038)    RPR Non Reactive (05/12 1038)  Anatomy US Nl girl 'Skar' Rubella  1.08 (05/12 1038)  Feeding Plan bottle HIV Non Reactive (05/12 1038)  Contraception BTL Hep C neg  Circumcision n/a    Pediatrician [facility] [place] A1C/GTT Early: 5.2     26-28wks: normal  Prenatal Classes discussed      GBS  neg   [ ]  PCN allergy  BTL Consent 06/21/22     VBAC Consent n/a PHQ9 & GAD7  [ ] New OB  [ ] 28wks   [ ] 36wks  Waterbirth [ ] Class [ ]  36wkCNM visit/consent       Prenatal Transfer Tool  Maternal Diabetes: No Genetic Screening: Normal Maternal Ultrasounds/Referrals: Normal Fetal Ultrasounds or other Referrals:  None Maternal Substance Abuse:  Yes:  Type: Marijuana Significant Maternal Medications:  None Significant Maternal Lab Results:  Group B Strep negative and Rh negative Number of Prenatal Visits:greater than 3 verified prenatal visits Other Comments:  None  Results for orders placed or performed in visit on 08/30/22 (from the past 24 hour(s))  POC Urinalysis Dipstick OB   Collection Time: 08/30/22 10:50 AM  Result Value Ref Range   Color, UA     Clarity, UA     Glucose, UA Negative Negative   Bilirubin, UA     Ketones, UA pos    Spec Grav, UA     Blood, UA trace    pH, UA     POC,PROTEIN,UA Negative Negative, Trace, Small (1+), Moderate (2+), Large (3+), 4+   Urobilinogen, UA     Nitrite, UA neg    Leukocytes, UA Small (1+) (A) Negative   Appearance     Odor      Patient Active Problem List   Diagnosis Date Noted   History of shoulder dystocia in prior pregnancy 08/30/2022   Request for sterilization 04/15/2022   History of severe pre-eclampsia 03/15/2022   Encounter for supervision of normal pregnancy, antepartum 03/15/2022   Rh negative state in antepartum period 03/15/2022   Marijuana use 01/13/2020    Assessment/Plan:  Kelsey Lambert is a 26 y.o. G2P1001 at [redacted]w[redacted]d here for IOL for gHTN. Also suspected to have UTI and candidal vaginitis. PEC labs negative, BP improved w/o treatment.   gHTN -continue to monitor  #Labor: FB placed in which patient had subsequent AROM during placement of FB. Consider pitocin when FB out. #Pain: None, desires epidural #FWB: Category 1 #ID:  GBS negative. Will treat UTI with ceftriaxone. Plan for diflucan post partum.  #MOF: bottle #MOC: BTL, consent sign  Margie Billet, MD  08/30/2022, 3:06 PM  GME ATTESTATION:  I saw and evaluated the patient. I agree with the  findings and the plan of care as documented in the resident's note. I have made changes to documentation as necessary.  Lavonda Jumbo, DO OB Fellow, Faculty [facility], Center for [facility] 08/30/2022, 5:50 PM

## 2022-08-30 NOTE — Anesthesia Preprocedure Evaluation (Signed)
Anesthesia Evaluation  Patient identified by MRN, date of birth, ID band Patient awake    Reviewed: Allergy & Precautions, Patient's Chart, lab work & pertinent test results  Airway Mallampati: II  TM Distance: >3 FB Neck ROM: Full    Dental no notable dental hx.    Pulmonary asthma (childhood) , former smoker,  Cough in labor deliver- denies any productive sputum, denies sick contacts   Pulmonary exam normal breath sounds clear to auscultation       Cardiovascular hypertension (PIH), Normal cardiovascular exam Rhythm:Regular Rate:Normal     Neuro/Psych negative neurological ROS  negative psych ROS   GI/Hepatic negative GI ROS, (+)       marijuana use,   Endo/Other  Obesity BMI 37  Renal/GU negative Renal ROS  negative genitourinary   Musculoskeletal negative musculoskeletal ROS (+)   Abdominal (+) + obese,   Peds negative pediatric ROS (+)  Hematology  (+) Blood dyscrasia, anemia , Hb 11.6, plt 251   Anesthesia Other Findings   Reproductive/Obstetrics (+) Pregnancy                             Anesthesia Physical Anesthesia Plan  ASA: 3  Anesthesia Plan: Epidural   Post-op Pain Management:    Induction:   PONV Risk Score and Plan: 2  Airway Management Planned: Natural Airway  Additional Equipment: None  Intra-op Plan:   Post-operative Plan:   Informed Consent: I have reviewed the patients History and Physical, chart, labs and discussed the procedure including the risks, benefits and alternatives for the proposed anesthesia with the patient or authorized representative who has indicated his/her understanding and acceptance.       Plan Discussed with:   Anesthesia Plan Comments:         Anesthesia Quick Evaluation

## 2022-08-30 NOTE — Progress Notes (Signed)
HIGH-RISK PREGNANCY VISIT Patient name: Kelsey Lambert MRN [id number]  Date of birth: 03/09/1996 Chief Complaint:   Routine Prenatal Visit  History of Present Illness:   Kelsey Lambert is a 26 y.o. G99P1001 female at [redacted]w[redacted]d with an Estimated Date of Delivery: 09/11/22 being seen today for ongoing management of a high-risk pregnancy complicated by [facility] dx today.    Today she reports  urinary frequency and discomfort, vulvar itching/irritation . Denies ha, visual changes, ruq/epigastric pain, n/v. Has lost 7lb from last week, states still has n/v- nothing has worsened in last week.   Contractions: Not present. Vag. Bleeding: None.  Movement: Present. denies leaking of fluid.      08/23/2022   12:10 PM 06/21/2022    9:18 AM 03/18/2022    9:57 AM 02/10/2020    9:23 AM 01/10/2020   10:00 AM  Depression screen PHQ 2/9  Decreased Interest 1 2 0 1 2  Down, Depressed, Hopeless 0 2 0 0 0  PHQ - 2 Score 1 4 0 1 2  Altered sleeping 3 2 1 2 2   Tired, decreased energy 2 2 1 2 1   Change in appetite 0 0 1 2 2   Feeling bad or failure about yourself  0 2 0 0 0  Trouble concentrating 0 2 0 2 0  Moving slowly or fidgety/restless 0 0 0 0 0  Suicidal thoughts 0 0 0 0 0  PHQ-9 Score 6 12 3 9 7         08/23/2022   12:11 PM 06/21/2022    9:18 AM 03/18/2022    9:57 AM 02/10/2020    9:23 AM  GAD 7 : Generalized Anxiety Score  Nervous, Anxious, on Edge 1 2 2 1   Control/stop worrying 1 2 0 1  Worry too much - different things 1 2 2 1   Trouble relaxing 1 2 1 1   Restless 2 2 1 1   Easily annoyed or irritable 1 2 1 1   Afraid - awful might happen 2 2 0 1  Total GAD 7 Score 9 14 7 7      Review of Systems:   Pertinent items are noted in HPI Denies abnormal vaginal discharge w/ itching/odor/irritation, headaches, visual changes, shortness of breath, chest pain, abdominal pain, severe nausea/vomiting, or problems with urination or bowel movements unless otherwise stated above. Pertinent History Reviewed:   Reviewed past medical,surgical, social, obstetrical and family history.  Reviewed problem list, medications and allergies. Physical Assessment:   Vitals:   08/30/22 1045 08/30/22 1053  BP: (!) 129/91 (!) 131/95  Pulse: 76 80  Weight: 180 lb 6.4 oz (81.8 kg)   Body mass index is 37.06 kg/m.           Physical Examination:   General appearance: alert, well appearing, and in no distress  Mental status: alert, oriented to person, place, and time  Skin: warm & dry   Extremities: Edema: None    Cardiovascular: normal heart rate noted  Respiratory: normal respiratory effort, no distress  Abdomen: gravid, soft, non-tender  Pelvic:  vulva appears normal, spec exam large amt thick clumpy yellow d/c c/w yeast, SVE 1.5/th/-3, vtx    Dilation: 1.5 Effacement (%): Thick Station: -3  Fetal Status: Fetal Heart Rate (bpm): 140 Fundal Height: 37 cm Movement: Present Presentation: Vertex  Fetal Surveillance Testing today: doppler   Chaperone: Celene Squibb    Results for orders placed or performed in visit on 08/30/22 (from the past 24 hour(s))  POC Urinalysis Dipstick  OB   Collection Time: 08/30/22 10:50 AM  Result Value Ref Range   Color, UA     Clarity, UA     Glucose, UA Negative Negative   Bilirubin, UA     Ketones, UA pos    Spec Grav, UA     Blood, UA trace    pH, UA     POC,PROTEIN,UA Negative Negative, Trace, Small (1+), Moderate (2+), Large (3+), 4+   Urobilinogen, UA     Nitrite, UA neg    Leukocytes, UA Small (1+) (A) Negative   Appearance     Odor      Assessment & Plan:  High-risk pregnancy: G2P1001 at [redacted]w[redacted]d with an Estimated Date of Delivery: 09/11/22   1) GHTN w/ h/o severe pre-e, elevated bp at 32 & 36wks (w/ few normals in between) and elevated again today. No proteinuria, aysmptomatic, to [facility] for direct admit, notified L&D charge and L&D provider team  2) Symptomatic vulvovaginal candida, plan treatment at [facility]  3) Presumed UTI> urinary frequency and dysuria, 1+  leuks, tr blood, feels like she has a UTI, tx at [facility]  4) H/O VAVD w/ 20sec SD> EFW 27%/2703g @ 36.4w  Meds: No orders of the defined types were placed in this encounter.  Labs/procedures today: spec exam and SVE  Treatment Plan:  to [facility]  Follow-up: Return for to schedule bp check after delivery.   Future Appointments  Date Time Provider [place]  09/06/2022 10:30 AM Roma Schanz, [place] [facility]  09/13/2022 10:30 AM Roma Schanz, CNM [facility]    Orders Placed This Encounter  Procedures   POC Urinalysis Dipstick OB   Roma Schanz [employer], [facility] 08/30/2022 12:49 PM

## 2022-08-30 NOTE — Progress Notes (Signed)
Patient ID: Kelsey Lambert, female   DOB: July 21, 1996, 26 y.o.   MRN: [id number] Kelsey Lambert is a 26 y.o. G2P1001 at [redacted]w[redacted]d admitted for induction of labor due to gestational hypertension   Subjective: Comfortable w/ epidural, nauseated. Denies ha, visual changes, ruq/epigastric pain   Objective: BP 114/78   Pulse 92   Temp 98 F (36.7 C) (Oral)   Resp 16   Ht 4' 10.5" (1.486 m)   Wt 81.6 kg   LMP 12/05/2021 (Exact Date)   BMI 36.98 kg/m  No intake/output data recorded.  FHR baseline 145 bpm, Variability: moderate, Accelerations:present, Decelerations:  Present  occ variable Toco: q 2-3 mins   SVE:   4.5/50/-2 by me Pitocin @ 4 mu/min  Labs: Lab Results  Component Value Date   WBC 14.9 (H) 08/30/2022   HGB 11.6 (L) 08/30/2022   HCT 34.1 (L) 08/30/2022   MCV 81.4 08/30/2022   PLT 251 08/30/2022    Assessment / Plan: IOL d/t gestational hypertension , incidental AROM w/ foley bulb placement at 1550, foley out @ 1900, on pit- continue to increase per protocol to achieve adequate labor  Labor: s/p cervical ripening Fetal Wellbeing:  Category II Pain Control:  epidural Pre-eclampsia: asymptomatic, bp's stable, and labs stable I/D:  GBS neg Anticipated MOD: NSVB  Roma Schanz CNM, WHNP-BC 08/30/2022, 11:05 PM

## 2022-08-30 NOTE — Anesthesia Procedure Notes (Signed)
Epidural Patient location during procedure: OB Start time: 08/30/2022 7:48 PM End time: 08/30/2022 7:56 PM  Staffing Anesthesiologist: Pervis Hocking, DO Performed: anesthesiologist   Preanesthetic Checklist Completed: patient identified, IV checked, risks and benefits discussed, monitors and equipment checked, pre-op evaluation and timeout performed  Epidural Patient position: sitting Prep: DuraPrep and site prepped and draped Patient monitoring: continuous pulse ox, blood pressure, heart rate and cardiac monitor Approach: midline Location: L3-L4 Injection technique: LOR air  Needle:  Needle type: Tuohy  Needle gauge: 17 G Needle length: 9 cm Needle insertion depth: 6 cm Catheter type: closed end flexible Catheter size: 19 Gauge Catheter at skin depth: 11 cm Test dose: negative  Assessment Sensory level: T8 Events: blood not aspirated, injection not painful, no injection resistance, no paresthesia and negative IV test  Additional Notes Patient identified. Risks/Benefits/Options discussed with patient including but not limited to bleeding, infection, nerve damage, paralysis, failed block, incomplete pain control, headache, blood pressure changes, nausea, vomiting, reactions to medication both or allergic, itching and postpartum back pain. Confirmed with bedside nurse the patient's most recent platelet count. Confirmed with patient that they are not currently taking any anticoagulation, have any bleeding history or any family history of bleeding disorders. Patient expressed understanding and wished to proceed. All questions were answered. Sterile technique was used throughout the entire procedure. Please see nursing notes for vital signs. Test dose was given through epidural catheter and negative prior to continuing to dose epidural or start infusion. Warning signs of high block given to the patient including shortness of breath, tingling/numbness in hands, complete motor  block, or any concerning symptoms with instructions to call for help. Patient was given instructions on fall risk and not to get out of bed. All questions and concerns addressed with instructions to call with any issues or inadequate analgesia.  Reason for block:procedure for pain

## 2022-08-30 NOTE — Progress Notes (Signed)
Patient ID: Kelsey Lambert, female   DOB: [birth year], 26 y.o.   MRN: [id number] Kelsey Lambert is a 26 y.o. G2P1001 at [redacted]w[redacted]d admitted for induction of labor due to gestational hypertension   Subjective: no complaints, feeling contractions some, but not uncomfortable, and denies headache, visual changes, ruq/epigastric pain, n/v  Objective: BP (!) 131/93   Pulse 80   Temp 98.2 F (36.8 C) (Oral)   Ht 4' 10.5" (1.486 m)   Wt 81.6 kg   LMP 12/05/2021 (Exact Date)   BMI 36.98 kg/m  No intake/output data recorded.  FHR baseline 150 bpm, Variability: moderate, Accelerations:present, Decelerations:  Present  occ variable Toco: q 2-4 mins   SVE:   Dilation: 3 Effacement (%): 50 Station: -3 Exam by:: Arby Barrette, RN  Foley bulb now out  Labs: Lab Results  Component Value Date   WBC 14.9 (H) 08/30/2022   HGB 11.6 (L) 08/30/2022   HCT 34.1 (L) 08/30/2022   MCV 81.4 08/30/2022   PLT 251 08/30/2022    Assessment / Plan: IOL d/t gestational hypertension , incidental AROM w/ foley bulb placement @ 9147, foley now out, will start pit per protocol   Labor: s/p cervical ripening Fetal Wellbeing:  Category II Pain Control:  n/a Pre-eclampsia: asymptomatic, bp's stable, and labs stable I/D:  GBS neg Anticipated MOD: NSVB  Roma Schanz CNM, WHNP-BC 08/30/2022, 7:38 PM

## 2022-08-31 ENCOUNTER — Encounter (HOSPITAL_COMMUNITY): Payer: Self-pay | Admitting: Obstetrics and Gynecology

## 2022-08-31 ENCOUNTER — Other Ambulatory Visit: Payer: Self-pay

## 2022-08-31 ENCOUNTER — Encounter (HOSPITAL_COMMUNITY)
Admission: AD | Disposition: A | Discharge status: Home / Self Care | Payer: Self-pay | Source: Home / Self Care | Attending: Obstetrics and Gynecology

## 2022-08-31 ENCOUNTER — Inpatient Hospital Stay (HOSPITAL_COMMUNITY): Payer: Medicaid Other | Admitting: Anesthesiology

## 2022-08-31 DIAGNOSIS — O134 Gestational [pregnancy-induced] hypertension without significant proteinuria, complicating childbirth: Secondary | ICD-10-CM

## 2022-08-31 DIAGNOSIS — I10 Essential (primary) hypertension: Secondary | ICD-10-CM

## 2022-08-31 DIAGNOSIS — O99324 Drug use complicating childbirth: Secondary | ICD-10-CM

## 2022-08-31 DIAGNOSIS — Z302 Encounter for sterilization: Secondary | ICD-10-CM

## 2022-08-31 DIAGNOSIS — Z87891 Personal history of nicotine dependence: Secondary | ICD-10-CM

## 2022-08-31 DIAGNOSIS — Z9079 Acquired absence of other genital organ(s): Secondary | ICD-10-CM

## 2022-08-31 DIAGNOSIS — J45909 Unspecified asthma, uncomplicated: Secondary | ICD-10-CM

## 2022-08-31 DIAGNOSIS — Z3A38 38 weeks gestation of pregnancy: Secondary | ICD-10-CM

## 2022-08-31 HISTORY — PX: TUBAL LIGATION: SHX77

## 2022-08-31 LAB — TYPE AND SCREEN
ABO/RH(D): A NEG
Antibody Screen: POSITIVE

## 2022-08-31 LAB — CBC
HCT: 34 % — ABNORMAL LOW (ref 36.0–46.0)
Hemoglobin: 11.3 g/dL — ABNORMAL LOW (ref 12.0–15.0)
MCH: 27.7 pg (ref 26.0–34.0)
MCHC: 33.2 g/dL (ref 30.0–36.0)
MCV: 83.3 fL (ref 80.0–100.0)
Platelets: 240 10*3/uL (ref 150–400)
RBC: 4.08 MIL/uL (ref 3.87–5.11)
RDW: 14.9 % (ref 11.5–15.5)
WBC: 17.4 10*3/uL — ABNORMAL HIGH (ref 4.0–10.5)
nRBC: 0 % (ref 0.0–0.2)

## 2022-08-31 LAB — RPR: RPR Ser Ql: NONREACTIVE

## 2022-08-31 SURGERY — LIGATION, FALLOPIAN TUBE, POSTPARTUM
Anesthesia: Epidural | Laterality: Bilateral

## 2022-08-31 MED ORDER — COCONUT OIL OIL: 1.0000 | TOPICAL_OIL | Status: DC | PRN

## 2022-08-31 MED ORDER — OXYCODONE HCL 5 MG PO TABS: 5.0000 mg | ORAL_TABLET | Freq: Once | ORAL | Status: DC | PRN

## 2022-08-31 MED ORDER — ZOLPIDEM TARTRATE 5 MG PO TABS: 5.0000 mg | ORAL_TABLET | Freq: Every evening | ORAL | Status: DC | PRN

## 2022-08-31 MED ORDER — DIPHENHYDRAMINE HCL 25 MG PO CAPS: 25.0000 mg | ORAL_CAPSULE | Freq: Four times a day (QID) | ORAL | Status: DC | PRN

## 2022-08-31 MED ORDER — BENZOCAINE-MENTHOL 20-0.5 % EX AERO: 1.0000 | INHALATION_SPRAY | CUTANEOUS | Status: DC | PRN

## 2022-08-31 MED ORDER — SENNOSIDES-DOCUSATE SODIUM 8.6-50 MG PO TABS
2.0000 | ORAL_TABLET | ORAL | Status: DC
  Administered 2022-09-01: 2 via ORAL
  Filled 2022-08-31: qty 2

## 2022-08-31 MED ORDER — OXYCODONE HCL 5 MG PO TABS
5.0000 mg | ORAL_TABLET | ORAL | Status: DC | PRN
  Administered 2022-08-31 – 2022-09-01 (×2): 10 mg via ORAL
  Filled 2022-08-31 (×2): qty 2

## 2022-08-31 MED ORDER — ONDANSETRON HCL 4 MG/2ML IJ SOLN
INTRAMUSCULAR | Status: AC
  Filled 2022-08-31: qty 2

## 2022-08-31 MED ORDER — SODIUM CHLORIDE 0.9% FLUSH
3.0000 mL | Freq: Two times a day (BID) | INTRAVENOUS | Status: DC
  Administered 2022-08-31: 3 mL via INTRAVENOUS

## 2022-08-31 MED ORDER — PRENATAL MULTIVITAMIN CH: 1.0000 | ORAL_TABLET | Freq: Every day | ORAL | Status: DC

## 2022-08-31 MED ORDER — BUPIVACAINE HCL (PF) 0.25 % IJ SOLN
INTRAMUSCULAR | Status: DC | PRN
  Administered 2022-08-31 (×2): 11 mL

## 2022-08-31 MED ORDER — BUPIVACAINE HCL (PF) 0.25 % IJ SOLN
INTRAMUSCULAR | Status: AC
  Filled 2022-08-31: qty 30

## 2022-08-31 MED ORDER — NIFEDIPINE ER OSMOTIC RELEASE 30 MG PO TB24
30.0000 mg | ORAL_TABLET | Freq: Every day | ORAL | Status: DC
  Administered 2022-08-31: 30 mg via ORAL
  Filled 2022-08-31: qty 1

## 2022-08-31 MED ORDER — PRENATAL MULTIVITAMIN CH
1.0000 | ORAL_TABLET | Freq: Every day | ORAL | Status: DC
  Administered 2022-09-01: 1 via ORAL

## 2022-08-31 MED ORDER — KETOROLAC TROMETHAMINE 30 MG/ML IJ SOLN
30.0000 mg | Freq: Four times a day (QID) | INTRAMUSCULAR | Status: DC
  Administered 2022-08-31 – 2022-09-01 (×3): 30 mg via INTRAVENOUS
  Filled 2022-08-31 (×3): qty 1

## 2022-08-31 MED ORDER — SODIUM CHLORIDE 0.9% FLUSH: 3.0000 mL | INTRAVENOUS | Status: DC | PRN

## 2022-08-31 MED ORDER — TETANUS-DIPHTH-ACELL PERTUSSIS 5-2.5-18.5 LF-MCG/0.5 IM SUSY: 0.5000 mL | PREFILLED_SYRINGE | Freq: Once | INTRAMUSCULAR | Status: DC

## 2022-08-31 MED ORDER — SODIUM CHLORIDE 0.9 % IV SOLN: 250.0000 mL | INTRAVENOUS | Status: DC | PRN

## 2022-08-31 MED ORDER — FENTANYL CITRATE (PF) 100 MCG/2ML IJ SOLN
INTRAMUSCULAR | Status: AC
  Filled 2022-08-31: qty 2

## 2022-08-31 MED ORDER — ACETAMINOPHEN 325 MG PO TABS: 650.0000 mg | ORAL_TABLET | ORAL | Status: DC | PRN

## 2022-08-31 MED ORDER — DIBUCAINE (PERIANAL) 1 % EX OINT: 1.0000 | TOPICAL_OINTMENT | CUTANEOUS | Status: DC | PRN

## 2022-08-31 MED ORDER — ONDANSETRON HCL 4 MG PO TABS: 4.0000 mg | ORAL_TABLET | ORAL | Status: DC | PRN

## 2022-08-31 MED ORDER — RHO D IMMUNE GLOBULIN 1500 UNIT/2ML IJ SOSY
300.0000 ug | PREFILLED_SYRINGE | Freq: Once | INTRAMUSCULAR | Status: AC
  Administered 2022-08-31: 300 ug via INTRAVENOUS
  Filled 2022-08-31: qty 2

## 2022-08-31 MED ORDER — SODIUM BICARBONATE 8.4 % IV SOLN
INTRAVENOUS | Status: AC
  Filled 2022-08-31: qty 50

## 2022-08-31 MED ORDER — ONDANSETRON HCL 4 MG/2ML IJ SOLN: 4.0000 mg | INTRAMUSCULAR | Status: DC | PRN

## 2022-08-31 MED ORDER — SODIUM BICARBONATE 8.4 % IV SOLN
INTRAVENOUS | Status: DC | PRN
  Administered 2022-08-31: 4 mL via EPIDURAL
  Administered 2022-08-31: 10 mL via EPIDURAL

## 2022-08-31 MED ORDER — LACTATED RINGERS AMNIOINFUSION: INTRAVENOUS | Status: DC

## 2022-08-31 MED ORDER — WITCH HAZEL-GLYCERIN EX PADS: 1.0000 | MEDICATED_PAD | CUTANEOUS | Status: DC | PRN

## 2022-08-31 MED ORDER — LACTATED RINGERS IV SOLN: INTRAVENOUS | Status: DC

## 2022-08-31 MED ORDER — MIDAZOLAM HCL 2 MG/2ML IJ SOLN
INTRAMUSCULAR | Status: AC
  Filled 2022-08-31: qty 2

## 2022-08-31 MED ORDER — IBUPROFEN 600 MG PO TABS
600.0000 mg | ORAL_TABLET | Freq: Four times a day (QID) | ORAL | Status: DC
  Administered 2022-08-31 (×2): 600 mg via ORAL
  Filled 2022-08-31 (×2): qty 1

## 2022-08-31 MED ORDER — METOCLOPRAMIDE HCL 10 MG PO TABS
10.0000 mg | ORAL_TABLET | Freq: Once | ORAL | Status: AC
  Administered 2022-08-31: 10 mg via ORAL
  Filled 2022-08-31: qty 1

## 2022-08-31 MED ORDER — ONDANSETRON HCL 4 MG/2ML IJ SOLN
INTRAMUSCULAR | Status: DC | PRN
  Administered 2022-08-31: 4 mg via INTRAVENOUS

## 2022-08-31 MED ORDER — FUROSEMIDE 20 MG PO TABS
20.0000 mg | ORAL_TABLET | Freq: Every day | ORAL | Status: DC
  Administered 2022-08-31: 20 mg via ORAL
  Filled 2022-08-31: qty 1

## 2022-08-31 MED ORDER — FENTANYL CITRATE (PF) 100 MCG/2ML IJ SOLN
INTRAMUSCULAR | Status: DC | PRN
  Administered 2022-08-31 (×2): 50 ug via INTRAVENOUS

## 2022-08-31 MED ORDER — HYDROMORPHONE HCL 1 MG/ML IJ SOLN
0.2500 mg | INTRAMUSCULAR | Status: DC | PRN
  Administered 2022-08-31: 0.5 mg via INTRAVENOUS

## 2022-08-31 MED ORDER — MIDAZOLAM HCL 5 MG/5ML IJ SOLN
INTRAMUSCULAR | Status: DC | PRN
  Administered 2022-08-31: .25 mg via INTRAVENOUS
  Administered 2022-08-31: 1 mg via INTRAVENOUS
  Administered 2022-08-31: .75 mg via INTRAVENOUS

## 2022-08-31 MED ORDER — MEASLES, MUMPS & RUBELLA VAC IJ SOLR: 0.5000 mL | Freq: Once | INTRAMUSCULAR | Status: DC

## 2022-08-31 MED ORDER — BISACODYL 10 MG RE SUPP: 10.0000 mg | Freq: Every day | RECTAL | Status: DC | PRN

## 2022-08-31 MED ORDER — FENTANYL CITRATE (PF) 100 MCG/2ML IJ SOLN
INTRAMUSCULAR | Status: DC | PRN
  Administered 2022-08-31: 100 ug via EPIDURAL

## 2022-08-31 MED ORDER — OXYCODONE HCL 5 MG/5ML PO SOLN: 5.0000 mg | Freq: Once | ORAL | Status: DC | PRN

## 2022-08-31 MED ORDER — IBUPROFEN 600 MG PO TABS: 600.0000 mg | ORAL_TABLET | Freq: Four times a day (QID) | ORAL | Status: DC

## 2022-08-31 MED ORDER — OXYCODONE HCL 5 MG PO TABS: 5.0000 mg | ORAL_TABLET | ORAL | Status: DC | PRN

## 2022-08-31 MED ORDER — FLEET ENEMA 7-19 GM/118ML RE ENEM: 1.0000 | ENEMA | Freq: Every day | RECTAL | Status: DC | PRN

## 2022-08-31 MED ORDER — SIMETHICONE 80 MG PO CHEW: 80.0000 mg | CHEWABLE_TABLET | ORAL | Status: DC | PRN

## 2022-08-31 MED ORDER — ACETAMINOPHEN 325 MG PO TABS
650.0000 mg | ORAL_TABLET | ORAL | Status: DC
  Administered 2022-08-31 – 2022-09-01 (×4): 650 mg via ORAL
  Filled 2022-08-31 (×4): qty 2

## 2022-08-31 MED ORDER — SENNOSIDES-DOCUSATE SODIUM 8.6-50 MG PO TABS: 2.0000 | ORAL_TABLET | Freq: Every day | ORAL | Status: DC

## 2022-08-31 MED ORDER — OXYTOCIN-SODIUM CHLORIDE 30-0.9 UT/500ML-% IV SOLN: 2.5000 [IU]/h | INTRAVENOUS | Status: DC | PRN

## 2022-08-31 MED ORDER — HYDROMORPHONE HCL 1 MG/ML IJ SOLN
INTRAMUSCULAR | Status: AC
  Filled 2022-08-31: qty 0.5

## 2022-08-31 MED ORDER — STERILE WATER FOR IRRIGATION IR SOLN
Status: DC | PRN
  Administered 2022-08-31: 1000 mL

## 2022-08-31 MED ORDER — PROMETHAZINE HCL 25 MG/ML IJ SOLN: 6.2500 mg | INTRAMUSCULAR | Status: DC | PRN

## 2022-08-31 MED ORDER — NIFEDIPINE ER OSMOTIC RELEASE 30 MG PO TB24
60.0000 mg | ORAL_TABLET | Freq: Every day | ORAL | Status: DC
  Administered 2022-09-01: 60 mg via ORAL
  Filled 2022-08-31: qty 2

## 2022-08-31 MED ORDER — FAMOTIDINE 20 MG PO TABS
40.0000 mg | ORAL_TABLET | Freq: Once | ORAL | Status: AC
  Administered 2022-08-31: 40 mg via ORAL
  Filled 2022-08-31: qty 2

## 2022-08-31 MED ORDER — LIDOCAINE-EPINEPHRINE (PF) 2 %-1:200000 IJ SOLN
INTRAMUSCULAR | Status: AC
  Filled 2022-08-31: qty 20

## 2022-08-31 MED ORDER — FLUCONAZOLE 150 MG PO TABS
150.0000 mg | ORAL_TABLET | Freq: Once | ORAL | Status: AC
  Administered 2022-08-31: 150 mg via ORAL
  Filled 2022-08-31: qty 1

## 2022-08-31 SURGICAL SUPPLY — 23 items
BLADE SURG 11 STRL SS (BLADE) ×1 IMPLANT
CHLORAPREP W/TINT 26 (MISCELLANEOUS) ×2 IMPLANT
CLIP FILSHIE TUBAL LIGA STRL (Clip) IMPLANT
DRSG OPSITE POSTOP 3X4 (GAUZE/BANDAGES/DRESSINGS) ×1 IMPLANT
DURAPREP 26ML APPLICATOR (WOUND CARE) IMPLANT
GLOVE BIOGEL PI IND STRL 7.0 (GLOVE) ×1 IMPLANT
GLOVE BIOGEL PI IND STRL 7.5 (GLOVE) ×2 IMPLANT
GLOVE ECLIPSE 7.5 STRL STRAW (GLOVE) ×1 IMPLANT
GOWN STRL REUS W/TWL LRG LVL3 (GOWN DISPOSABLE) ×2 IMPLANT
NEEDLE HYPO 22GX1.5 SAFETY (NEEDLE) ×1 IMPLANT
NS IRRIG 1000ML POUR BTL (IV SOLUTION) ×1 IMPLANT
PACK ABDOMINAL MINOR (CUSTOM PROCEDURE TRAY) ×1 IMPLANT
PROTECTOR NERVE ULNAR (MISCELLANEOUS) ×1 IMPLANT
SPONGE LAP 18X18 RF (DISPOSABLE) ×1 IMPLANT
SPONGE LAP 4X18 RFD (DISPOSABLE) IMPLANT
SUT PLAIN 0 NONE (SUTURE) IMPLANT
SUT PLAIN 2 0 (SUTURE) ×1
SUT PLAIN ABS 2-0 CT1 27XMFL (SUTURE) IMPLANT
SUT VICRYL 0 UR6 27IN ABS (SUTURE) ×1 IMPLANT
SUT VICRYL 4-0 PS2 18IN ABS (SUTURE) ×1 IMPLANT
SYR CONTROL 10ML LL (SYRINGE) ×1 IMPLANT
TOWEL OR 17X24 6PK STRL BLUE (TOWEL DISPOSABLE) ×2 IMPLANT
TRAY FOLEY W/BAG SLVR 14FR (SET/KITS/TRAYS/PACK) ×1 IMPLANT

## 2022-08-31 NOTE — Progress Notes (Signed)
RN called first call Dr. Charleston Ropes, who reffered to Dr. Damita Dunnings, to report pt BP 156/94 and rating her pain 10/10 abdominal cramping. PT denies altered vision, headache, or chest pain.  Dr. Damita Dunnings States she will enter  med orders to address increased pain and continue to monitor BP after pain is controlled, states she will likely alter BP meds in AM.

## 2022-08-31 NOTE — Anesthesia Preprocedure Evaluation (Signed)
Anesthesia Evaluation  Patient identified by MRN, date of birth, ID band Patient awake    Reviewed: Allergy & Precautions, Patient's Chart, lab work & pertinent test results  Airway Mallampati: II  TM Distance: >3 FB Neck ROM: Full    Dental no notable dental hx.    Pulmonary asthma (childhood) , former smoker,  Cough in labor deliver- denies any productive sputum, denies sick contacts   Pulmonary exam normal breath sounds clear to auscultation       Cardiovascular hypertension, Normal cardiovascular exam Rhythm:Regular Rate:Normal     Neuro/Psych negative neurological ROS  negative psych ROS   GI/Hepatic negative GI ROS,   Endo/Other  Obesity BMI 37  Renal/GU negative Renal ROS  negative genitourinary   Musculoskeletal negative musculoskeletal ROS (+)   Abdominal (+) + obese,   Peds negative pediatric ROS (+)  Hematology  (+) Blood dyscrasia, anemia , Hb 11.6, plt 251   Anesthesia Other Findings   Reproductive/Obstetrics (+) Pregnancy                             Anesthesia Physical  Anesthesia Plan  ASA: 3  Anesthesia Plan: Epidural   Post-op Pain Management:    Induction:   PONV Risk Score and Plan: 2  Airway Management Planned: Natural Airway  Additional Equipment: None  Intra-op Plan:   Post-operative Plan:   Informed Consent: I have reviewed the patients History and Physical, chart, labs and discussed the procedure including the risks, benefits and alternatives for the proposed anesthesia with the patient or authorized representative who has indicated his/her understanding and acceptance.       Plan Discussed with:   Anesthesia Plan Comments:         Anesthesia Quick Evaluation

## 2022-08-31 NOTE — Discharge Summary (Shared)
Postpartum Discharge Summary  Date of Service updated***     Patient Name: Kelsey Lambert DOB: 07/03/1996 MRN: [id number]  Date of admission: 08/30/2022 Delivery date:08/31/2022  Delivering provider: Wells Guiles R  Date of discharge: 08/31/2022  Admitting diagnosis: Indication for care in labor and delivery, antepartum [O75.9] Intrauterine pregnancy: [redacted]w[redacted]d    Secondary diagnosis:  Principal Problem:   Indication for care in labor and delivery, antepartum  Additional problems: gestational hypertension    Discharge diagnosis: Term Pregnancy Delivered and Gestational Hypertension                                              Post partum procedures:{Postpartum procedures:23558} Augmentation: AROM, Pitocin, and IP Foley Complications: None  Hospital course: Induction of Labor With Vaginal Delivery   26y.o. yo G2P1001 at 329w3das admitted to the hospital 08/30/2022 for induction of labor.  Indication for induction: Gestational hypertension.  Patient had an labor course complicated by variables requiring amnioinfusion.  Membrane Rupture Time/Date: 3:50 PM ,08/30/2022   Delivery Method:Vaginal, Spontaneous  Episiotomy: None  Lacerations:  None  Details of delivery can be found in separate delivery note.  Patient had a postpartum course complicated by***. Patient is discharged home 08/31/22.  Newborn Data: Birth date:08/31/2022  Birth time:3:16 AM  Gender:Female  Living status:Living  Apgars:9 ,9  Weight:2690 g   Magnesium Sulfate received: {Mag received:30440022} BMZ received: No Rhophylac:{Rhophylac received:30440032} MMR:N/A T-DaP:Given prenatally Flu: {F{NKN:39767}ransfusion:{Transfusion received:30440034}  Physical exam  Vitals:   08/31/22 0101 08/31/22 0208 08/31/22 0231 08/31/22 0233  BP: 133/89 126/86 (!) 140/100 (!) 142/91  Pulse: 86 87 86 96  Resp:      Temp:      TempSrc:      Weight:      Height:       General: {Exam;  general:21111117} Lochia: {Desc; appropriate/inappropriate:30686::"appropriate"} Uterine Fundus: {Desc; firm/soft:30687} Incision: {Exam; incision:21111123} DVT Evaluation: {Exam; dvt:2111122} Labs: Lab Results  Component Value Date   WBC 14.9 (H) 08/30/2022   HGB 11.6 (L) 08/30/2022   HCT 34.1 (L) 08/30/2022   MCV 81.4 08/30/2022   PLT 251 08/30/2022      Latest Ref Rng & Units 08/30/2022    3:19 PM  CMP  Glucose 70 - 99 mg/dL 79   BUN 6 - 20 mg/dL 7   Creatinine 0.44 - 1.00 mg/dL 0.45   Sodium 135 - 145 mmol/L 137   Potassium 3.5 - 5.1 mmol/L 3.5   Chloride 98 - 111 mmol/L 107   CO2 22 - 32 mmol/L 18   Calcium 8.9 - 10.3 mg/dL 9.0   Total Protein 6.5 - 8.1 g/dL 6.6   Total Bilirubin 0.3 - 1.2 mg/dL 0.4   Alkaline Phos 38 - 126 U/L 236   AST 15 - 41 U/L 36   ALT 0 - 44 U/L 36    Edinburgh Score:    04/28/2020    3:00 PM  Edinburgh Postnatal Depression Scale Screening Tool  I have been able to laugh and see the funny side of things. 0  I have looked forward with enjoyment to things. 1  I have blamed myself unnecessarily when things went wrong. 2  I have been anxious or worried for no good reason. 3  I have felt scared or panicky for no good reason. 2  Things have been getting on  top of me. 1  I have been so unhappy that I have had difficulty sleeping. 0  I have felt sad or miserable. 1  I have been so unhappy that I have been crying. 0  The thought of harming myself has occurred to me. 0  Edinburgh Postnatal Depression Scale Total 10     After visit meds:  Allergies as of 08/31/2022   No Known Allergies   Med Rec must be completed prior to using this [facility]***        Discharge home in stable condition Infant Feeding: {Baby feeding:23562} Infant Disposition:{CHL IP OB HOME WITH IPRKSY:45733} Discharge instruction: per After Visit Summary and Postpartum booklet. Activity: Advance as tolerated. Pelvic rest for 6 weeks.  Diet: {OB  WKET:01599689} Future Appointments: Future Appointments  Date Time Provider [place]  09/06/2022 10:30 AM Roma Schanz, CNM [facility]  09/13/2022 10:30 AM Roma Schanz, CNM [facility]   Follow up Visit: Roma Schanz, CNM  P Ft Admin Pool Please schedule this patient for PP visit in: bp check w/ nurse 1wk, pp visit 4-6wks High risk pregnancy complicated by: GHTN Delivery mode:  SVD Anticipated Birth Control:  plans BTL in hospital PP Procedures needed: BP check Schedule Integrated Roberts visit: no Provider: Any provider  08/31/2022 Roma Schanz, CNM

## 2022-08-31 NOTE — Anesthesia Postprocedure Evaluation (Signed)
Anesthesia Post Note  Patient: Kelsey Lambert  Procedure(s) Performed: POST PARTUM TUBAL LIGATION (Bilateral)     Patient location during evaluation: PACU Anesthesia Type: Epidural Level of consciousness: awake and alert Pain management: pain level controlled Vital Signs Assessment: post-procedure vital signs reviewed and stable Respiratory status: spontaneous breathing, nonlabored ventilation and respiratory function stable Cardiovascular status: blood pressure returned to baseline and stable Postop Assessment: no apparent nausea or vomiting Anesthetic complications: no   No notable events documented.  Last Vitals:  Vitals:   08/31/22 1719 08/31/22 1730  BP: (!) 152/99 (!) 152/96  Pulse: 83 81  Resp: 19 17  Temp:    SpO2: 96% 96%    Last Pain:  Vitals:   08/31/22 1730  TempSrc:   PainSc: 3    Pain Goal:    LLE Motor Response: Purposeful movement (08/31/22 1719) LLE Sensation: Numbness, Tingling (08/31/22 1719) RLE Motor Response: Purposeful movement (08/31/22 1719) RLE Sensation: Numbness, Tingling (08/31/22 1719)     Epidural/Spinal Function Cutaneous sensation: Tingles (08/31/22 1700), Patient able to flex knees: Yes (08/31/22 1700), Patient able to lift hips off bed: Yes (08/31/22 1700), Back pain beyond tenderness at insertion site: No (08/31/22 1700), Progressively worsening motor and/or sensory loss: No (08/31/22 1700), Bowel and/or bladder incontinence post epidural: No (08/31/22 1700)  [name]

## 2022-08-31 NOTE — Anesthesia Postprocedure Evaluation (Signed)
Anesthesia Post Note  Patient: Kelsey Lambert  Procedure(s) Performed: AN AD Jordan     Patient location during evaluation: Mother Baby Anesthesia Type: Epidural Level of consciousness: awake and alert Pain management: pain level controlled Vital Signs Assessment: post-procedure vital signs reviewed and stable Respiratory status: spontaneous breathing, nonlabored ventilation and respiratory function stable Cardiovascular status: stable Postop Assessment: no headache, no backache and epidural receding Anesthetic complications: no   No notable events documented.  Last Vitals:  Vitals:   08/31/22 0520 08/31/22 0620  BP: (!) 139/94 128/82  Pulse: 92 86  Resp: 18 17  Temp: 37.1 C 37 C  SpO2: 98% 99%    Last Pain:  Vitals:   08/31/22 0620  TempSrc: Oral  PainSc:    Pain Goal:                   Cassidee Deats

## 2022-08-31 NOTE — Progress Notes (Signed)
Patient ID: Kelsey Lambert, female   DOB: 12/16/1995, 26 y.o.   MRN: [id number] Kelsey Lambert is a 26 y.o. G2P1001 at [redacted]w[redacted]d admitted for induction of labor due to gestational hypertension   Subjective: Some pain LLQ, still some nausea.  Denies ha, visual changes, ruq/epigastric pain.  Objective: BP 133/89   Pulse 86   Temp 98.1 F (36.7 C) (Oral)   Resp 16   Ht 4' 10.5" (1.486 m)   Wt 81.6 kg   LMP 12/05/2021 (Exact Date)   BMI 36.98 kg/m  No intake/output data recorded.  FHR baseline 135 bpm, Variability: moderate, Accelerations:present, Decelerations:  Present  variables Toco: q 2-3 mins   SVE:   9/90/+1, vtx IUPC w/o difficulty  Pitocin @ 10 mu/min  Labs: Lab Results  Component Value Date   WBC 14.9 (H) 08/30/2022   HGB 11.6 (L) 08/30/2022   HCT 34.1 (L) 08/30/2022   MCV 81.4 08/30/2022   PLT 251 08/30/2022    Assessment / Plan: IOL d/t GHTN, now 9/90/+1, IUPC placed for amnioinfusion d/t variables  Labor: active Fetal Wellbeing:  Category II Pain Control:  epidural Pre-eclampsia: asymptomatic, bp's stable, and labs stable I/D:  GBS neg Anticipated MOD: NSVB  Roma Schanz CNM, WHNP-BC 08/31/2022, 2:02 AM

## 2022-08-31 NOTE — Op Note (Signed)
Kelsey Lambert 08/30/2022 - 08/31/2022  PREOPERATIVE DIAGNOSIS:  Undesired fertility  POSTOPERATIVE DIAGNOSIS:  Undesired fertility  PROCEDURE:  Postpartum Bilateral Tubal Sterilization using the modified Pomeroy method   ANESTHESIA:  Epidural  ASSISTANTS: Basilio Cairo, MD  COMPLICATIONS:  None immediate.  ESTIMATED BLOOD LOSS:  Less than 20cc.  FLUIDS: 800 cc LR.  URINE OUTPUT:  20 cc of clear urine.  INDICATIONS: 26 y.o. yo G2P2002  with undesired fertility,status post vaginal delivery, desires permanent sterilization. Risks and benefits of procedure discussed with patient including permanence of method, bleeding, infection, injury to surrounding organs and need for additional procedures. Risk failure of 0.5-1% with increased risk of ectopic gestation if pregnancy occurs was also discussed with patient.   FINDINGS:  Normal uterus, tubes, and ovaries.  TECHNIQUE: After informed consent was obtained, the patient was taken to the operating room where anesthesia was induced and found to be adequate. A Foley catheter was inserted to drain the bladder. The skin was prepped in the standard fashion. 11 ml of 0.25% marcaine was injected subcutaneously. A small, transverse, infraumbilical skin incision was made with the scalpel. This incision was carried down to the underlying layer of fascia. There was a significant amount of adipose tissue and an umbilical hernia so it took some time to arrive at the fascia. The fascia was grasped with Allis clamps, tented up, and entered sharply with Mayo scissors. Underlying peritoneum was then identified, tented up, and entered sharply with Metzenbaum scissors. The patient's left fallopian tube was then identified, brought to the incision, and grasped with a Babcock clamp. The tube was then followed out to the fimbria.  Kelly forceps were placed on the mesosalpinx underneath most of the tube.  This pedicle was double suture ligated with 2-0 plain gut,  and the tube including the fimbriated end was excised.  The right fallopian tube was then identified, doubly ligated, and was excised in a similar fashion allowing for bilateral tubal sterilization via bilateral salpingectomy.  Good hemostasis was noted overall. The fascia was re-approximated with 0 Vicryl. The subcutaneous layer was reapproximated with 2-0 plain gut. The skin was closed in a subcuticular fashion with 3-0 Vicryl. The patient tolerated the procedure well. Sponge, lap, and needle count were correct x2. The patient was taken to recovery room in stable condition.

## 2022-08-31 NOTE — Transfer of Care (Signed)
Immediate Anesthesia Transfer of Care Note  Patient: Kelsey Lambert  Procedure(s) Performed: POST PARTUM TUBAL LIGATION (Bilateral)  Patient Location: PACU  Anesthesia Type:Epidural  Level of Consciousness: awake, alert  and oriented  Airway & Oxygen Therapy: Patient Spontanous Breathing  Post-op Assessment: Report given to RN and Post -op Vital signs reviewed and stable  Post vital signs: Reviewed and stable  Last Vitals:  Vitals Value Taken Time  BP 136/95 08/31/22 1638  Temp 36.6 C 08/31/22 1638  Pulse 93 08/31/22 1641  Resp 23 08/31/22 1641  SpO2 95 % 08/31/22 1641  Vitals shown include unvalidated device data.  Last Pain:  Vitals:   08/31/22 1638  TempSrc: Oral  PainSc: 5          Complications: No notable events documented.

## 2022-08-31 NOTE — Progress Notes (Signed)
Patient desires permanent sterilization.  Other reversible forms of contraception were discussed with patient; she declines all other modalities. Risks of procedure discussed with patient including but not limited to: risk of regret, permanence of method, bleeding, infection, injury to surrounding organs and need for additional procedures.  Failure risk of 1-2 % with increased risk of ectopic gestation if pregnancy occurs was also discussed with patient. Discussed risks/benefits of whole versus partial salpingectomy, patient elects the former, aware anatomy and other issues could preclude. Patient verbalized understanding of these risks and wants to proceed with sterilization.  Written informed consent obtained.  To OR when ready.

## 2022-09-01 ENCOUNTER — Other Ambulatory Visit (HOSPITAL_COMMUNITY): Payer: Self-pay

## 2022-09-01 LAB — CULTURE, OB URINE

## 2022-09-01 LAB — RH IG WORKUP (INCLUDES ABO/RH)
Fetal Screen: NEGATIVE
Gestational Age(Wks): 38.3
Unit division: 0

## 2022-09-01 LAB — CBC
HCT: 34.7 % — ABNORMAL LOW (ref 36.0–46.0)
Hemoglobin: 11.6 g/dL — ABNORMAL LOW (ref 12.0–15.0)
MCH: 27.6 pg (ref 26.0–34.0)
MCHC: 33.4 g/dL (ref 30.0–36.0)
MCV: 82.4 fL (ref 80.0–100.0)
Platelets: 271 10*3/uL (ref 150–400)
RBC: 4.21 MIL/uL (ref 3.87–5.11)
RDW: 15.1 % (ref 11.5–15.5)
WBC: 18.1 10*3/uL — ABNORMAL HIGH (ref 4.0–10.5)
nRBC: 0 % (ref 0.0–0.2)

## 2022-09-01 MED ORDER — OXYCODONE HCL 5 MG PO TABS
5.0000 mg | ORAL_TABLET | Freq: Four times a day (QID) | ORAL | 0 refills | Status: DC | PRN
  Filled 2022-09-01: qty 10, 3d supply, fill #0

## 2022-09-01 MED ORDER — FUROSEMIDE 20 MG PO TABS
20.0000 mg | ORAL_TABLET | Freq: Every day | ORAL | 0 refills | Status: DC
  Filled 2022-09-01: qty 4, 4d supply, fill #0

## 2022-09-01 MED ORDER — SENNOSIDES-DOCUSATE SODIUM 8.6-50 MG PO TABS
2.0000 | ORAL_TABLET | ORAL | 0 refills | Status: DC
  Filled 2022-09-01: qty 30, 15d supply, fill #0

## 2022-09-01 MED ORDER — NIFEDIPINE ER 60 MG PO TB24
60.0000 mg | ORAL_TABLET | Freq: Every day | ORAL | 0 refills | Status: AC
  Filled 2022-09-01: qty 30, 30d supply, fill #0

## 2022-09-01 MED ORDER — IBUPROFEN 800 MG PO TABS
800.0000 mg | ORAL_TABLET | Freq: Three times a day (TID) | ORAL | 0 refills | Status: DC | PRN
  Filled 2022-09-01: qty 30, 10d supply, fill #0

## 2022-09-01 MED ORDER — FUROSEMIDE 20 MG PO TABS
20.0000 mg | ORAL_TABLET | Freq: Every day | ORAL | Status: DC
  Administered 2022-09-01: 20 mg via ORAL
  Filled 2022-09-01: qty 1

## 2022-09-01 NOTE — Clinical Social Work Maternal (Signed)
CLINICAL SOCIAL WORK MATERNAL/CHILD NOTE  Patient Details  Name: Kelsey Lambert MRN: [id number] Date of Birth: [birth year]  Date:  09/01/2022  Clinical Social Worker Initiating Note:  Letta Kocher, LCSWA Date/Time: Initiated:  09/01/22/1138     Child's Name:  Arva Chafe   Biological Parents:  Mother, Father Tesa Meadors 05-Sep-1995, Jamie [facility] 11/06/1990)   Need for Interpreter:  None   Reason for Referral:  Current Substance Use/Substance Use During Pregnancy     Address:  [place] [address]    Phone number:  [phone] (home)     Additional phone number:   Household Members/Support Persons (HM/SP):   Household Member/Support Person 1, Household Member/Support Person 2, Household Member/Support Person 3, Household Member/Support Person 4, Household Member/Support Person 5   HM/SP Name Relationship DOB or Age  HM/SP -1 Lynelle Smoke Burns mother 08/14/1968  HM/SP -Naranja sister 03/19/1993  HM/SP -3 Quentin Cornwall nephew 10/31/2012  HM/SP -4 Leory Plowman Southern nephew 08/11/2014  HM/SP -5 [name] Blackstock daughter 04/28/2020  HM/SP -6        HM/SP -7        HM/SP -8          Natural Supports (not living in the home):  Spouse/significant other   Professional Supports: None   Employment: Unemployed   Type of Work:     Education:  9 to 11 years   Homebound arranged: No  Financial Resources:  [employer]   Other Resources:  Physicist, medical  , [place] Considerations Which May Impact Care:    Strengths:  Ability to meet basic needs  , Compliance with medical plan  , Pediatrician chosen   Psychotropic Medications:         Pediatrician:    [facility] area  Pediatrician List:   [facility]  [place]      Pediatrician Fax Number:    Risk Factors/Current Problems:  Substance Use     Cognitive State:  Able to  Concentrate  , Alert     Mood/Affect:  Calm  , Comfortable  , Interested     CSW Assessment: CSW received consult for THC use. CSW met with MOB to complete assessment and offer support. When CSW entered the room MOB was resting on the bed and FOB was holding the infant near by. CSW introduced self, CSW role and reason for visit. MOB was agreeable to visit and gave CSW verbal permission to speak while FOB was present. CSW inquired about how MOB was feeling, MOB reported she was feeling ok and the delivery went smooth. CSW inquired about MH concerns MOB denied any. CSW assessed for safety, MOB denied any SI or HI. MOB identified her sister and FOB as her supports. CSW provided education regarding the baby blues period vs. perinatal mood disorders, discussed treatment and gave resources for mental health follow up if concerns arise.  CSW recommends self-evaluation during the postpartum time period using the New Mom Checklist from Postpartum Progress and encouraged MOB to contact a medical professional if symptoms are noted at any time.    CSW inquired about MOB THC use, MOB reported she used daily throughout the pregnancy for her nausea and to help her eat. MOB reported her last use was 4 days ago. CSW explained the hospital drug screen policy, MOB voiced understanding, CSW notified MOB the infants UDS was  positive for THC and a CPS report would be made, MOB voiced understanding. CPS report made to [facility]  CSW provided review of Sudden Infant Death Syndrome (SIDS) precautions. MOB reported she has all necessary items for the infant including a bassinet for her to sleep.    CSW identifies no further need for intervention and no barriers to discharge at this time.   CSW Plan/Description:  No Further Intervention Required/No Barriers to Discharge, Sudden Infant Death Syndrome (SIDS) Education, Perinatal Mood and Anxiety Disorder (PMADs) Education, Child Protective Service Report  , CSW Will Continue  to Monitor Umbilical Cord Tissue Drug Screen Results and Make Report if Renata Caprice, LCSW 09/01/2022, 11:51 AM

## 2022-09-01 NOTE — Progress Notes (Signed)
POSTPARTUM PROGRESS NOTE  Post Partum Day 1, POD#1   Subjective:  Kelsey Lambert is a 26 y.o. [phone] s/p VD at [redacted]w[redacted]d and POD1 after BTL. IOL for gHTN. She reports she is doing well. No acute events overnight aside from an elevated BP during a 10/10 pain episode. She denies any problems with ambulating, voiding or po intake. Denies nausea or vomiting.  Pain is well controlled this AM.  Kelsey Lambert is improving.  Objective: Blood pressure 129/89, pulse 73, temperature 98 F (36.7 C), temperature source Oral, resp. rate 17, height 4' 10.5" (1.486 m), weight 81.6 kg, last menstrual period 12/05/2021, SpO2 100 %, unknown if currently breastfeeding.  Physical Exam:  General: alert, cooperative and no distress Chest: no respiratory distress Heart:regular rate, distal pulses intact Abdomen: soft, nontender,  Uterine Fundus: firm, appropriately tender DVT Evaluation: No calf swelling or tenderness Extremities: No edema Skin: warm, dry. Incision CDI  Recent Labs    08/31/22 1650 09/01/22 0343  HGB 11.3* 11.6*  HCT 34.0* 34.7*    Assessment/Plan: Kelsey Lambert is a 26 y.o. [phone] s/p VD and BTL at [redacted]w[redacted]d   PPD#1 - Doing well  Routine postpartum care POD#1 - Meeting post-op goals HTN: Starting Nifedipine XL 60 today Edema: Given Lasix Yeast infection: S/p diflucan UTI: S/p CTX Contraception: BTL - done Feeding: bottle Dispo: Plan for discharge today.   LOS: 2 days   Wilhemina Cash, MD PGY-1 Family Medicine Resident [place] 09/01/2022, 7:22 AM

## 2022-09-01 NOTE — Discharge Summary (Signed)
Postpartum Discharge Summary  Date of Service updated 10/26     Patient Name: Kelsey Lambert DOB: [birth year] MRN: [id number]  Date of admission: 08/30/2022 Delivery date:08/31/2022  Delivering provider: Wells Guiles R  Date of discharge: 09/01/2022  Admitting diagnosis: Indication for care in labor and delivery, antepartum [O75.9] Intrauterine pregnancy: [redacted]w[redacted]d    Secondary diagnosis:  Principal Problem:   Indication for care in labor and delivery, antepartum Active Problems:   Status post bilateral salpingectomy  Additional problems: gestational hypertension    Discharge diagnosis: Term Pregnancy Delivered and Gestational Hypertension                                              Post partum procedures:postpartum tubal ligation Augmentation: AROM, Pitocin, and IP Foley Complications: None  Hospital course: Induction of Labor With Vaginal Delivery   26y.o. yo G2P1001 at 357w3das admitted to the hospital 08/30/2022 for induction of labor.  Indication for induction: Gestational hypertension.  Patient had an labor course complicated by variables requiring amnioinfusion.  Membrane Rupture Time/Date: 3:50 PM ,08/30/2022   Delivery Method:Vaginal, Spontaneous  Episiotomy: None  Lacerations:  None  Details of delivery can be found in separate delivery note.  Patient had a postpartum course complicated by elevated blood pressures. Patient is discharged home 09/01/22.  Newborn Data: Birth date:08/31/2022  Birth time:3:16 AM  Gender:Female  Living status:Living  Apgars:9 ,9  Weight:2690 g   Magnesium Sulfate received: No BMZ received: No Rhophylac:Yes MMR:N/A T-DaP:Given prenatally Flu: No Transfusion:No Had bacteruria and yeast infection: Given CTX and diflucan  Physical exam  Vitals:   08/31/22 1903 08/31/22 2330 09/01/22 0237 09/01/22 0641  BP: (!) 156/94 (!) 137/97 135/73 129/89  Pulse:  71 79 73  Resp: 18 17 17 17   Temp: 98.1 F (36.7 C) 98.2 F (36.8  C) 98.1 F (36.7 C) 98 F (36.7 C)  TempSrc: Oral Oral Oral Oral  SpO2: 98% 96% 99% 100%  Weight:      Height:       General: alert, cooperative, and no distress Lochia: appropriate Uterine Fundus: firm Incision: Healing well with no significant drainage, No significant erythema, Dressing is clean, dry, and intact, (BTL) DVT Evaluation: No evidence of DVT seen on physical exam. Labs: Lab Results  Component Value Date   WBC 18.1 (H) 09/01/2022   HGB 11.6 (L) 09/01/2022   HCT 34.7 (L) 09/01/2022   MCV 82.4 09/01/2022   PLT 271 09/01/2022      Latest Ref Rng & Units 08/30/2022    3:19 PM  CMP  Glucose 70 - 99 mg/dL 79   BUN 6 - 20 mg/dL 7   Creatinine 0.44 - 1.00 mg/dL 0.45   Sodium 135 - 145 mmol/L 137   Potassium 3.5 - 5.1 mmol/L 3.5   Chloride 98 - 111 mmol/L 107   CO2 22 - 32 mmol/L 18   Calcium 8.9 - 10.3 mg/dL 9.0   Total Protein 6.5 - 8.1 g/dL 6.6   Total Bilirubin 0.3 - 1.2 mg/dL 0.4   Alkaline Phos 38 - 126 U/L 236   AST 15 - 41 U/L 36   ALT 0 - 44 U/L 36    Edinburgh Score:    08/31/2022    9:10 AM  Edinburgh Postnatal Depression Scale Screening Tool  I have been able to laugh and  see the funny side of things. 0  I have looked forward with enjoyment to things. 0  I have blamed myself unnecessarily when things went wrong. 0  I have been anxious or worried for no good reason. 2  I have felt scared or panicky for no good reason. 2  Things have been getting on top of me. 1  I have been so unhappy that I have had difficulty sleeping. 0  I have felt sad or miserable. 0  I have been so unhappy that I have been crying. 1  The thought of harming myself has occurred to me. 0  Edinburgh Postnatal Depression Scale Total 6     After visit meds:  Allergies as of 09/01/2022   No Known Allergies      Medication List     STOP taking these medications    aspirin 81 MG chewable tablet       TAKE these medications    furosemide 20 MG tablet Commonly  known as: LASIX Take 1 tablet (20 mg total) by mouth daily. Start taking on: September 02, 2022   ibuprofen 800 MG tablet Commonly known as: ADVIL Take 1 tablet (800 mg total) by mouth every 8 (eight) hours as needed. Take with meals   NIFEdipine 60 MG 24 hr tablet Commonly known as: ADALAT CC Take 1 tablet (60 mg total) by mouth daily. Start taking on: September 02, 2022   oxyCODONE 5 MG immediate release tablet Commonly known as: Oxy IR/ROXICODONE Take 1 tablet (5 mg total) by mouth every 6 (six) hours as needed (pain scale 4-7).   prenatal multivitamin Tabs tablet Take 1 tablet by mouth daily.   senna-docusate 8.6-50 MG tablet Commonly known as: Senokot-S Take 2 tablets by mouth daily.         Discharge home in stable condition Infant Feeding: Bottle Infant Disposition:home with mother Discharge instruction: per After Visit Summary and Postpartum booklet. Activity: Advance as tolerated. Pelvic rest for 6 weeks.  Diet: routine diet Future Appointments: Future Appointments  Date Time Provider [place]  09/08/2022 10:50 AM [facility] NURSE [facility]  10/05/2022  1:50 PM Myrtis Ser, CNM [facility]   Follow up Visit: Roma Schanz, CNM  P Ft Admin Pool Please schedule this patient for PP visit in: bp check w/ nurse 1wk, pp visit 4-6wks High risk pregnancy complicated by: GHTN, started on 60 nifedipine Edema: 4 days left of Lasix Delivery mode:  SVD Anticipated Birth Control: s/p BTL in hospital PP Procedures needed: BP check Schedule Integrated BH visit: no Provider: Any provider  Wilhemina Cash, MD PGY-1 Family Medicine Resident [place]

## 2022-09-02 LAB — SURGICAL PATHOLOGY

## 2022-09-06 ENCOUNTER — Encounter: Payer: Medicaid Other | Admitting: Women's Health

## 2022-09-08 ENCOUNTER — Telehealth: Payer: Medicaid Other

## 2022-09-12 ENCOUNTER — Telehealth (HOSPITAL_COMMUNITY): Payer: Self-pay | Admitting: *Deleted

## 2022-09-12 NOTE — Telephone Encounter (Signed)
Left phone voicemail message.  Odis Hollingshead, RN 09-12-2022 at 10:09am

## 2022-09-13 ENCOUNTER — Encounter: Payer: Medicaid Other | Admitting: Women's Health

## 2022-10-05 ENCOUNTER — Ambulatory Visit (INDEPENDENT_AMBULATORY_CARE_PROVIDER_SITE_OTHER): Payer: Medicaid Other | Admitting: Advanced Practice Midwife

## 2022-10-05 ENCOUNTER — Encounter: Payer: Self-pay | Admitting: Advanced Practice Midwife

## 2022-10-05 ENCOUNTER — Encounter: Payer: Self-pay | Admitting: *Deleted

## 2022-10-05 DIAGNOSIS — F53 Postpartum depression: Secondary | ICD-10-CM

## 2022-10-05 NOTE — Patient Instructions (Signed)
Try this resource for helpful tips with postpartum depression: www.postpartum.net

## 2022-10-05 NOTE — Progress Notes (Signed)
POSTPARTUM VISIT Patient name: Kelsey Lambert MRN [id number]  Date of birth: November 30, 1995 Chief Complaint:   Postpartum Care  History of Present Illness:   Kelsey Lambert is a 26 y.o. G65P2002 Caucasian female being seen today for a postpartum visit. She is 5 weeks postpartum following a spontaneous vaginal delivery at 38.3 gestational weeks. IOL: yes, for gestational hypertension . Anesthesia: epidural.  Laceration: none.  Complications: labor/delivery were uncomplicated; she required Lasix and ProcardiaXL 71m PP for BP control. Inpatient contraception: yes , ppBTL .   [facility]; rubella equivocal . Tobacco use: former . Substance use disorder: no. Last pap smear: March 2021 and results were NILM w/ HRHPV not done. Next pap smear due: March 2024 Patient's last menstrual period was 09/26/2022.  Postpartum course has been complicated by requiring ProcardiaXL 60 for BP control . Bleeding none. Bowel function is normal. Bladder function is normal. Urinary incontinence? no, fecal incontinence? no Patient is not sexually active. Last sexual activity: prior to birth of baby. Desired contraception: BTL done PP. Patient does not want a pregnancy in the future.  Desired family size is 2 children.   The pregnancy intention screening data noted above was reviewed. Potential methods of contraception were discussed. The patient elected to proceed with No data recorded.  Edinburgh Postpartum Depression Screening: positive: H/O mental health disorder: no. Currently on meds: yes Celexa 147m.  Currently in therapy: no.  Sleeping: as expected w newborn.  Appetite: nl.  Still finds joy in things she used to: Yes.  Support at home: yes.  SI/HI/II: no.  Interested in medicine: Yes.  Interested in therapy: Yes.  Edinburgh Postnatal Depression Scale - 10/05/22 1346       Edinburgh Postnatal Depression Scale:  In the Past 7 Days   I have been able to laugh and see the funny side of  things. 0    I have looked forward with enjoyment to things. 0    I have blamed myself unnecessarily when things went wrong. 1    I have been anxious or worried for no good reason. 2    I have felt scared or panicky for no good reason. 2    Things have been getting on top of me. 2    I have been so unhappy that I have had difficulty sleeping. 2    I have felt sad or miserable. 1    I have been so unhappy that I have been crying. 2    The thought of harming myself has occurred to me. 0    Edinburgh Postnatal Depression Scale Total 12                08/23/2022   12:11 PM 06/21/2022    9:18 AM 03/18/2022    9:57 AM 02/10/2020    9:23 AM  GAD 7 : Generalized Anxiety Score  Nervous, Anxious, on Edge _0 Control/stop worrying 1 2 0 1  Worry too much - different things _1 Trouble relaxing _2 Restless _3 Easily annoyed or irritable _4 Afraid - awful might happen 2 2 0 1  Total GAD 7 Score _5 Baby's course has been uncomplicated. Baby is feeding by bottle. Infant has a pediatrician/family doctor? Yes.  Childcare strategy if returning to work/school: n/a-stay at home mom.  Pt has material  needs met for her and baby: Yes.   Review of Systems:   Pertinent items are noted in HPI Denies Abnormal vaginal discharge w/ itching/odor/irritation, headaches, visual changes, shortness of breath, chest pain, abdominal pain, severe nausea/vomiting, or problems with urination or bowel movements. Pertinent History Reviewed:  Reviewed past medical,surgical, obstetrical and family history.  Reviewed problem list, medications and allergies. OB History  Gravida Para Term Preterm AB Living  _0 SAB IAB Ectopic Multiple Live Births        0 2    # Outcome Date GA Lbr Len/2nd Weight Sex Delivery Anes PTL Lv  2 Term 08/31/22 [redacted]w[redacted]d/ 00:06 5 lb 14.9 oz (2.69 kg) F Vag-Spont EPI  LIV  1 Term 04/28/20 359w4d1:08 / 00:36 7 lb 3.7 oz (3.28 kg) F Vag-Vacuum EPI  N LIV     Birth Comments: WNL     Complications: Severe pre-eclampsia   Physical Assessment:   Vitals:   10/05/22 1353  BP: 113/83  Pulse: 79  Weight: 180 lb (81.6 kg)  Height: 4' 10.5" (1.486 m)  Body mass index is 36.98 kg/m.       Physical Examination:   General appearance: alert, well appearing, and in no distress  Mental status: alert, oriented to person, place, and time  Skin: warm & dry   Cardiovascular: normal heart rate noted   Respiratory: normal respiratory effort, no distress   Breasts: deferred, no complaints   Abdomen: soft, non-tender   Pelvic: examination not indicated. Thin prep pap obtained: No  Rectal: not examined  Extremities: Edema: none         No results found for this or any previous visit (from the past 24 hour(s)).  Assessment & Plan:  1) Postpartum exam 2) Five wks s/p spontaneous vaginal delivery & ppBTL 3) bottle feeding 4) Depression screening: positive EPDS 12; feels like Celexa 1010ms helping; interested in [facility] In[facility]ferral ordered 5) gHTN; taking ProcardiaXL 60m18mec to stop and then get RN BP check in 1wk  Essential components of care per ACOG recommendations:  1.  Mood and well being:  If positive depression screen, discussed and plan developed.  If using tobacco we discussed reduction/cessation and risk of relapse If current substance abuse, we discussed and referral to local resources was offered.   2. Infant care and feeding:  If breastfeeding, discussed returning to work, pumping, breastfeeding-associated pain, guidance regarding return to fertility while lactating if not using another method. If needed, patient was provided with a letter to be allowed to pump q 2-3hrs to support lactation in a private location with access to a refrigerator to store breastmilk.   Recommended that all caregivers be immunized for flu, pertussis and other preventable communicable diseases If pt does not have material needs met for her/baby, referred  to local resources for help obtaining these.  3. Sexuality, contraception and birth spacing Provided guidance regarding sexuality, management of dyspareunia, and resumption of intercourse Discussed avoiding interpregnancy interval <6mth18mnd recommended birth spacing of 18 months  4. Sleep and fatigue Discussed coping options for fatigue and sleep disruption Encouraged family/partner/community support of 4 hrs of uninterrupted sleep to help with mood and fatigue  5. Physical recovery  If pt had a C/S, assessed incisional pain and providing guidance on normal vs prolonged recovery If pt had a laceration, perineal healing and pain reviewed.  If urinary or fecal incontinence, discussed management and referred to PT or uro/gyn  if indicated  Patient is safe to resume physical activity. Discussed attainment of healthy weight.  6.  Chronic disease management Discussed pregnancy complications if any, and their implications for future childbearing and long-term maternal health. Review recommendations for prevention of recurrent pregnancy complications, such as 17 hydroxyprogesterone caproate to reduce risk for recurrent PTB not applicable, or aspirin to reduce risk of preeclampsia yes. Pt had GDM: no. If yes, 2hr GTT scheduled: not applicable. Reviewed medications and non-pregnant dosing including consideration of whether pt is breastfeeding using a reliable resource such as LactMed: not applicable Referred for f/u w/ PCP or subspecialist providers as indicated: not applicable  7. Health maintenance Mammogram at 27yo or earlier if indicated Pap smears as indicated  Meds: No orders of the defined types were placed in this encounter.   Follow-up: Return for RN BP check in 1wk; Pap/physical March 2024.   Orders Placed This Encounter  Procedures   Ambulatory referral to [place] 10/05/2022 2:10 PM

## 2022-10-28 ENCOUNTER — Telehealth: Payer: Self-pay | Admitting: Clinical

## 2022-10-28 NOTE — Telephone Encounter (Signed)
Attempt call regarding referral; Left HIPPA-compliant message to call back Amol Domanski from [facility] at  [phone] (Chayse Zatarain's office).    

## 2024-01-23 ENCOUNTER — Other Ambulatory Visit: Payer: Self-pay

## 2024-01-23 ENCOUNTER — Encounter (HOSPITAL_COMMUNITY): Payer: Self-pay | Admitting: *Deleted

## 2024-01-23 ENCOUNTER — Emergency Department (HOSPITAL_COMMUNITY)

## 2024-01-23 ENCOUNTER — Emergency Department (HOSPITAL_COMMUNITY)
Admission: EM | Admit: 2024-01-23 | Discharge: 2024-01-23 | Disposition: A | Discharge status: Home / Self Care | Attending: Emergency Medicine | Admitting: Emergency Medicine

## 2024-01-23 DIAGNOSIS — Z79899 Other long term (current) drug therapy: Secondary | ICD-10-CM | POA: Diagnosis not present

## 2024-01-23 DIAGNOSIS — J45909 Unspecified asthma, uncomplicated: Secondary | ICD-10-CM | POA: Insufficient documentation

## 2024-01-23 DIAGNOSIS — R112 Nausea with vomiting, unspecified: Secondary | ICD-10-CM

## 2024-01-23 DIAGNOSIS — J101 Influenza due to other identified influenza virus with other respiratory manifestations: Secondary | ICD-10-CM | POA: Diagnosis not present

## 2024-01-23 DIAGNOSIS — E876 Hypokalemia: Secondary | ICD-10-CM | POA: Diagnosis not present

## 2024-01-23 LAB — CBC WITH DIFFERENTIAL/PLATELET
Abs Immature Granulocytes: 0.02 10*3/uL (ref 0.00–0.07)
Basophils Absolute: 0 10*3/uL (ref 0.0–0.1)
Basophils Relative: 0 %
Eosinophils Absolute: 0.1 10*3/uL (ref 0.0–0.5)
Eosinophils Relative: 2 %
HCT: 41 % (ref 36.0–46.0)
Hemoglobin: 13.7 g/dL (ref 12.0–15.0)
Immature Granulocytes: 0 %
Lymphocytes Relative: 5 %
Lymphs Abs: 0.4 10*3/uL — ABNORMAL LOW (ref 0.7–4.0)
MCH: 27.6 pg (ref 26.0–34.0)
MCHC: 33.4 g/dL (ref 30.0–36.0)
MCV: 82.7 fL (ref 80.0–100.0)
Monocytes Absolute: 0.8 10*3/uL (ref 0.1–1.0)
Monocytes Relative: 10 %
Neutro Abs: 6.6 10*3/uL (ref 1.7–7.7)
Neutrophils Relative %: 83 %
Platelets: 215 10*3/uL (ref 150–400)
RBC: 4.96 MIL/uL (ref 3.87–5.11)
RDW: 14.5 % (ref 11.5–15.5)
WBC: 8 10*3/uL (ref 4.0–10.5)
nRBC: 0 % (ref 0.0–0.2)

## 2024-01-23 LAB — COMPREHENSIVE METABOLIC PANEL
ALT: 21 U/L (ref 0–44)
AST: 24 U/L (ref 15–41)
Albumin: 4.4 g/dL (ref 3.5–5.0)
Alkaline Phosphatase: 73 U/L (ref 38–126)
Anion gap: 11 (ref 5–15)
BUN: 19 mg/dL (ref 6–20)
CO2: 21 mmol/L — ABNORMAL LOW (ref 22–32)
Calcium: 8.9 mg/dL (ref 8.9–10.3)
Chloride: 104 mmol/L (ref 98–111)
Creatinine, Ser: 0.56 mg/dL (ref 0.44–1.00)
GFR, Estimated: >60 mL/min (ref >60)
Glucose, Bld: 123 mg/dL — ABNORMAL HIGH (ref 70–99)
Potassium: 3.2 mmol/L — ABNORMAL LOW (ref 3.5–5.1)
Sodium: 136 mmol/L (ref 135–145)
Total Bilirubin: 0.6 mg/dL (ref 0.0–1.2)
Total Protein: 7.9 g/dL (ref 6.5–8.1)

## 2024-01-23 LAB — URINALYSIS, ROUTINE W REFLEX MICROSCOPIC
Bilirubin Urine: NEGATIVE
Glucose, UA: NEGATIVE mg/dL
Hgb urine dipstick: NEGATIVE
Ketones, ur: 80 mg/dL — AB
Leukocytes,Ua: NEGATIVE
Nitrite: NEGATIVE
Protein, ur: 100 mg/dL — AB
Specific Gravity, Urine: 1.032 — ABNORMAL HIGH (ref 1.005–1.030)
pH: 5 (ref 5.0–8.0)

## 2024-01-23 LAB — RESP PANEL BY RT-PCR (RSV, FLU A&B, COVID)  RVPGX2
Influenza A by PCR: POSITIVE — AB
Influenza B by PCR: NEGATIVE
Resp Syncytial Virus by PCR: NEGATIVE
SARS Coronavirus 2 by RT PCR: NEGATIVE

## 2024-01-23 LAB — HCG, SERUM, QUALITATIVE: Preg, Serum: NEGATIVE

## 2024-01-23 LAB — LIPASE, BLOOD: Lipase: 21 U/L (ref 11–51)

## 2024-01-23 LAB — MAGNESIUM: Magnesium: 2.1 mg/dL (ref 1.7–2.4)

## 2024-01-23 MED ORDER — METOCLOPRAMIDE HCL 5 MG/ML IJ SOLN
10.0000 mg | Freq: Once | INTRAMUSCULAR | Status: AC
  Administered 2024-01-23: 10 mg via INTRAVENOUS
  Filled 2024-01-23: qty 2

## 2024-01-23 MED ORDER — PROMETHAZINE HCL 25 MG RE SUPP: 25.0000 mg | Freq: Four times a day (QID) | RECTAL | 0 refills | Status: AC | PRN

## 2024-01-23 MED ORDER — PROMETHAZINE HCL 25 MG PO TABS: 25.0000 mg | ORAL_TABLET | Freq: Four times a day (QID) | ORAL | 0 refills | Status: AC | PRN

## 2024-01-23 MED ORDER — KETOROLAC TROMETHAMINE 15 MG/ML IJ SOLN
15.0000 mg | Freq: Once | INTRAMUSCULAR | Status: AC
  Administered 2024-01-23: 15 mg via INTRAVENOUS
  Filled 2024-01-23: qty 1

## 2024-01-23 MED ORDER — IOHEXOL 300 MG/ML  SOLN
100.0000 mL | Freq: Once | INTRAMUSCULAR | Status: AC | PRN
  Administered 2024-01-23: 100 mL via INTRAVENOUS

## 2024-01-23 MED ORDER — SODIUM CHLORIDE 0.9 % IV SOLN
12.5000 mg | Freq: Once | INTRAVENOUS | Status: AC
  Administered 2024-01-23: 12.5 mg via INTRAVENOUS
  Filled 2024-01-23: qty 0.5

## 2024-01-23 MED ORDER — DROPERIDOL 2.5 MG/ML IJ SOLN
0.6250 mg | Freq: Once | INTRAMUSCULAR | Status: AC
  Administered 2024-01-23: 0.625 mg via INTRAVENOUS
  Filled 2024-01-23: qty 2

## 2024-01-23 MED ORDER — LACTATED RINGERS IV BOLUS
2000.0000 mL | Freq: Once | INTRAVENOUS | Status: AC
  Administered 2024-01-23: 2000 mL via INTRAVENOUS

## 2024-01-23 MED ORDER — POTASSIUM CHLORIDE 20 MEQ PO PACK
40.0000 meq | PACK | Freq: Once | ORAL | Status: AC
  Administered 2024-01-23: 40 meq via ORAL
  Filled 2024-01-23: qty 2

## 2024-01-23 NOTE — Discharge Instructions (Addendum)
 You have the flu.  Your test results were otherwise reassuring.  You will need to drink plenty fluids to stay hydrated.  Prescriptions for the antinausea medication, promethazine, were sent to your pharmacy.  Take this as needed.  Return to the emergency department for any new or worsening symptoms of concern.

## 2024-01-23 NOTE — ED Provider Notes (Addendum)
 [facility] AT [facility] Provider Note   CSN: [id number] Arrival date & time: 01/23/24  0458     History  Chief Complaint  Patient presents with   Abdominal Pain    Kelsey Lambert is a 28 y.o. female.  HPI Patient presents for nausea and vomiting.  Medical history includes asthma, marijuana use.  For the past 24 hours, patient has had persistent nausea, vomiting, and p.o. intolerance.  She has not been able to tolerate any p.o. intake and vomiting has progressed to dry heaving.  With her frequent vomiting, she has developed some abdominal discomfort and headache.  She has not tried any antiemetic at home.  She has had sick contacts at home with similar symptoms.    Home Medications Prior to Admission medications   Medication Sig Start Date End Date Taking? Authorizing Provider  promethazine (PHENERGAN) 25 MG suppository Place 1 suppository (25 mg total) rectally every 6 (six) hours as needed for nausea or vomiting. 01/23/24  Yes Gloris Manchester, MD  promethazine (PHENERGAN) 25 MG tablet Take 1 tablet (25 mg total) by mouth every 6 (six) hours as needed for nausea or vomiting. 01/23/24  Yes Gloris Manchester, MD  CELEXA 10 MG tablet Take 10 mg by mouth daily. 09/14/22   [provider]  NIFEdipine (ADALAT CC) 60 MG 24 hr tablet Take 1 tablet (60 mg total) by mouth daily. 09/02/22   Ndulue, Nadene Rubins, MD      Allergies    Patient has no known allergies.    Review of Systems   Review of Systems  Constitutional:  Positive for fatigue.  Gastrointestinal:  Positive for abdominal pain, nausea and vomiting.  All other systems reviewed and are negative.   Physical Exam Updated Vital Signs BP (!) 124/99   Pulse 99   Temp 98.3 F (36.8 C) (Oral)   Resp 13   Ht 4' 10.5" (1.486 m)   Wt 90.7 kg   LMP 12/26/2023   SpO2 93%   BMI 41.09 kg/m  Physical Exam Vitals and nursing note reviewed.  Constitutional:      General: She is not in acute  distress.    Appearance: She is well-developed. She is not ill-appearing, toxic-appearing or diaphoretic.  HENT:     Head: Normocephalic and atraumatic.     Mouth/Throat:     Mouth: Mucous membranes are moist.  Eyes:     Conjunctiva/sclera: Conjunctivae normal.  Cardiovascular:     Rate and Rhythm: Normal rate and regular rhythm.     Heart sounds: No murmur heard. Pulmonary:     Effort: Pulmonary effort is normal. No respiratory distress.     Breath sounds: Normal breath sounds.  Abdominal:     Palpations: Abdomen is soft.     Tenderness: There is generalized abdominal tenderness and tenderness in the epigastric area. There is no right CVA tenderness, left CVA tenderness, guarding or rebound.  Musculoskeletal:        General: No swelling.     Cervical back: Neck supple.  Skin:    General: Skin is warm and dry.  Neurological:     General: No focal deficit present.     Mental Status: She is alert and oriented to person, place, and time.  Psychiatric:        Mood and Affect: Mood normal.        Behavior: Behavior normal.     ED Results / Procedures / Treatments   Labs (all labs  ordered are listed, but only abnormal results are displayed) Labs Reviewed  RESP PANEL BY RT-PCR (RSV, FLU A&B, COVID)  RVPGX2 - Abnormal; Notable for the following components:      Result Value   Influenza A by PCR POSITIVE (*)    All other components within normal limits  COMPREHENSIVE METABOLIC PANEL - Abnormal; Notable for the following components:   Potassium 3.2 (*)    CO2 21 (*)    Glucose, Bld 123 (*)    All other components within normal limits  CBC WITH DIFFERENTIAL/PLATELET - Abnormal; Notable for the following components:   Lymphs Abs 0.4 (*)    All other components within normal limits  URINALYSIS, ROUTINE W REFLEX MICROSCOPIC - Abnormal; Notable for the following components:   APPearance HAZY (*)    Specific Gravity, Urine 1.032 (*)    Ketones, ur 80 (*)    Protein, ur 100 (*)     Bacteria, UA RARE (*)    All other components within normal limits  LIPASE, BLOOD  MAGNESIUM  HCG, SERUM, QUALITATIVE    EKG EKG Interpretation Date/Time:  Tuesday January 23 2024 05:58:45 EDT Ventricular Rate:  98 PR Interval:  145 QRS Duration:  101 QT Interval:  364 QTC Calculation: 465 R Axis:   0  Text Interpretation: Sinus rhythm Confirmed by Gloris Manchester ([id number]) on 01/23/2024 6:47:03 AM  Radiology CT ABDOMEN PELVIS W CONTRAST Result Date: 01/23/2024 CLINICAL DATA:  Abdominal pain, acute, nonlocalized Pt states she has been vomiting for the last 24 hours and states she is now dry heaving; pt c/o headache EXAM: CT ABDOMEN AND PELVIS WITH CONTRAST TECHNIQUE: Multidetector CT imaging of the abdomen and pelvis was performed using the standard protocol following bolus administration of intravenous contrast. RADIATION DOSE REDUCTION: This exam was performed according to the departmental dose-optimization program which includes automated exposure control, adjustment of the mA and/or kV according to patient size and/or use of iterative reconstruction technique. CONTRAST:  OMNIPAQUE IOHEXOL 300 MG/ML  SOLN COMPARISON:  None Available. FINDINGS: Lower chest: A 5 mm left lower lobe pulmonary nodule. Possible tiny hiatal hernia. Hepatobiliary: No focal liver abnormality. No gallstones, gallbladder wall thickening, or pericholecystic fluid. No biliary dilatation. Pancreas: No focal lesion. Normal pancreatic contour. No surrounding inflammatory changes. No main pancreatic ductal dilatation. Spleen: Normal in size without focal abnormality. Adrenals/Urinary Tract: No adrenal nodule bilaterally. Bilateral kidneys enhance symmetrically. No hydronephrosis. No hydroureter. The urinary bladder is unremarkable. Stomach/Bowel: Stomach is within normal limits. No evidence of bowel wall thickening or dilatation. Appendix appears normal. Vascular/Lymphatic: No abdominal aorta or iliac aneurysm. Mild  atherosclerotic plaque of the aorta and its branches. No abdominal, pelvic, or inguinal lymphadenopathy. Reproductive: Uterus and bilateral adnexa are unremarkable. Other: No intraperitoneal free fluid. No intraperitoneal free gas. No organized fluid collection. Musculoskeletal: No abdominal wall hernia or abnormality. No suspicious lytic or blastic osseous lesions. No acute displaced fracture. IMPRESSION: 1. Indeterminate 5mm left lower lobe pulmonary nodule. 2. Possible tiny hiatal hernia. 3. No acute intra-abdominal or intrapelvic abnormality. Electronically Signed   By: Tish Frederickson M.D.   On: 01/23/2024 08:34    Procedures Procedures    Medications Ordered in ED Medications  promethazine (PHENERGAN) 12.5 mg in sodium chloride 0.9 % 50 mL IVPB (12.5 mg Intravenous New Bag/Given 01/23/24 0846)  lactated ringers bolus 2,000 mL (2,000 mLs Intravenous New Bag/Given 01/23/24 0608)  droperidol (INAPSINE) 2.5 MG/ML injection 0.625 mg (0.625 mg Intravenous Given 01/23/24 0604)  potassium chloride (KLOR-CON) packet 40  mEq (40 mEq Oral Given 01/23/24 0701)  metoCLOPramide (REGLAN) injection 10 mg (10 mg Intravenous Given 01/23/24 0659)  iohexol (OMNIPAQUE) 300 MG/ML solution 100 mL (100 mLs Intravenous Contrast Given 01/23/24 0737)  ketorolac (TORADOL) 15 MG/ML injection 15 mg (15 mg Intravenous Given 01/23/24 0847)    ED Course/ Medical Decision Making/ A&P                                 Medical Decision Making Amount and/or Complexity of Data Reviewed Labs: ordered. Radiology: ordered.  Risk Prescription drug management.   This patient presents to the ED for concern of nausea and vomiting, this involves an extensive number of treatment options, and is a complaint that carries with it a high risk of complications and morbidity.  The differential diagnosis includes enteritis, GERD, hyperemesis syndrome, URI, dehydration, metabolic derangements   Co morbidities that complicate the patient  evaluation  Asthma, marijuana use   Additional history obtained:  Additional history obtained from N/A External records from outside source obtained and reviewed including EMR   Lab Tests:  I Ordered, and personally interpreted labs.  The pertinent results include: Mild hypokalemia with otherwise normal electrolytes, no leukocytosis.  Patient tested positive for influenza A.   Imaging Studies ordered:  I ordered imaging studies including CT of abdomen and pelvis I independently visualized and interpreted imaging which showed no acute findings I agree with the radiologist interpretation   Cardiac Monitoring: / EKG:  The patient was maintained on a cardiac monitor.  I personally viewed and interpreted the cardiac monitored which showed an underlying rhythm of: Sinus rhythm  Problem List / ED Course / Critical interventions / Medication management  Patient presenting for nausea, vomiting, and p.o. intolerance for the past 24 hours.  Vital signs on arrival are reassuring.  Patient is overall well-appearing on exam.  She does endorse ongoing nausea.  She has developed some abdominal pain which she attributes to the frequent vomiting.  Mild tenderness is present on exam.  2 L of IV fluid were ordered for hydration.  Workup was initiated.  Droperidol was ordered for treatment of nausea.  On reassessment, patient reports ongoing nausea.  Dose of Reglan was ordered.  Lab work notable for mild hypokalemia and positive testing for influenza A.  Replacement potassium was ordered.  On reassessment, patient has ongoing nausea.  Dose of Reglan was ordered.  On further reassessment she endorses ongoing nausea and is not willing to attempt p.o. challenge.  Dose of Phenergan was ordered.  CT scan did not show acute findings.  Phenergan did improve her symptoms.  She was able to tolerate p.o. intake.  Phenergan was prescribed.  She was discharged in stable condition. I ordered medication including IV  fluids for hydration; droperidol, Reglan, Phenergan for nausea; potassium chloride for hypokalemia Reevaluation of the patient after these medicines showed that the patient improved I have reviewed the patients home medicines and have made adjustments as needed   Social Determinants of Health:  Lives independently         Final Clinical Impression(s) / ED Diagnoses Final diagnoses:  Influenza A  Nausea and vomiting, unspecified vomiting type    Rx / DC Orders ED Discharge Orders          Ordered    promethazine (PHENERGAN) 25 MG tablet  Every 6 hours PRN        01/23/24 0850    promethazine (  PHENERGAN) 25 MG suppository  Every 6 hours PRN        01/23/24 0850              Gloris Manchester, MD 01/23/24 1610    Gloris Manchester, MD 01/23/24 [phone]

## 2024-01-23 NOTE — ED Triage Notes (Signed)
 Pt states she has been vomiting for the last 24 hours and states she is now dry heaving; pt c/o headache  Pt has sick contacts in the home with same symptoms
